# Patient Record
Sex: Female | Born: 1960
Health system: Southern US, Community
[De-identification: ages and names within clinical notes are randomized; demographics above are authoritative.]

## PROBLEM LIST (undated history)

## (undated) DIAGNOSIS — E785 Hyperlipidemia, unspecified: Secondary | ICD-10-CM

## (undated) DIAGNOSIS — G43909 Migraine, unspecified, not intractable, without status migrainosus: Secondary | ICD-10-CM

## (undated) DIAGNOSIS — T7840XA Allergy, unspecified, initial encounter: Secondary | ICD-10-CM

## (undated) DIAGNOSIS — M810 Age-related osteoporosis without current pathological fracture: Secondary | ICD-10-CM

## (undated) HISTORY — DX: Allergy, unspecified, initial encounter: T78.40XA

## (undated) HISTORY — PX: TUBAL LIGATION: SHX77

## (undated) HISTORY — DX: Migraine, unspecified, not intractable, without status migrainosus: G43.909

## (undated) HISTORY — DX: Hyperlipidemia, unspecified: E78.5

## (undated) HISTORY — DX: Age-related osteoporosis without current pathological fracture: M81.0

---

## 1999-12-19 HISTORY — PX: ABDOMINAL HYSTERECTOMY: SHX81

## 2000-05-04 ENCOUNTER — Other Ambulatory Visit: Admission: RE | Admit: 2000-05-04 | Discharge: 2000-05-04 | Payer: Self-pay | Admitting: Obstetrics and Gynecology

## 2000-10-11 ENCOUNTER — Encounter (INDEPENDENT_AMBULATORY_CARE_PROVIDER_SITE_OTHER): Payer: Self-pay | Admitting: Specialist

## 2000-10-11 ENCOUNTER — Inpatient Hospital Stay (HOSPITAL_COMMUNITY): Admission: RE | Admit: 2000-10-11 | Discharge: 2000-10-13 | Payer: Self-pay | Admitting: Obstetrics and Gynecology

## 2001-03-11 ENCOUNTER — Encounter: Payer: Self-pay | Admitting: Obstetrics and Gynecology

## 2001-03-11 ENCOUNTER — Encounter: Admission: RE | Admit: 2001-03-11 | Discharge: 2001-03-11 | Payer: Self-pay | Admitting: Obstetrics and Gynecology

## 2001-09-19 ENCOUNTER — Ambulatory Visit (HOSPITAL_COMMUNITY): Admission: RE | Admit: 2001-09-19 | Discharge: 2001-09-19 | Payer: Self-pay | Admitting: Family Medicine

## 2001-09-19 ENCOUNTER — Encounter: Payer: Self-pay | Admitting: Family Medicine

## 2002-01-06 ENCOUNTER — Other Ambulatory Visit: Admission: RE | Admit: 2002-01-06 | Discharge: 2002-01-06 | Payer: Self-pay | Admitting: Obstetrics and Gynecology

## 2002-10-28 ENCOUNTER — Encounter: Payer: Self-pay | Admitting: Obstetrics and Gynecology

## 2002-10-28 ENCOUNTER — Encounter: Admission: RE | Admit: 2002-10-28 | Discharge: 2002-10-28 | Payer: Self-pay | Admitting: Obstetrics and Gynecology

## 2004-01-15 ENCOUNTER — Ambulatory Visit (HOSPITAL_COMMUNITY): Admission: RE | Admit: 2004-01-15 | Discharge: 2004-01-15 | Payer: Self-pay | Admitting: Obstetrics and Gynecology

## 2005-04-07 ENCOUNTER — Ambulatory Visit (HOSPITAL_COMMUNITY): Admission: RE | Admit: 2005-04-07 | Discharge: 2005-04-07 | Payer: Self-pay | Admitting: Obstetrics and Gynecology

## 2006-04-12 ENCOUNTER — Ambulatory Visit (HOSPITAL_COMMUNITY): Admission: RE | Admit: 2006-04-12 | Discharge: 2006-04-12 | Payer: Self-pay | Admitting: Obstetrics and Gynecology

## 2007-05-27 ENCOUNTER — Encounter: Admission: RE | Admit: 2007-05-27 | Discharge: 2007-08-25 | Payer: Self-pay | Admitting: Sports Medicine

## 2008-01-10 ENCOUNTER — Ambulatory Visit (HOSPITAL_COMMUNITY): Admission: RE | Admit: 2008-01-10 | Discharge: 2008-01-10 | Payer: Self-pay | Admitting: Obstetrics and Gynecology

## 2009-11-03 ENCOUNTER — Ambulatory Visit (HOSPITAL_COMMUNITY): Admission: RE | Admit: 2009-11-03 | Discharge: 2009-11-03 | Payer: Self-pay | Admitting: Obstetrics and Gynecology

## 2011-02-01 ENCOUNTER — Other Ambulatory Visit (HOSPITAL_COMMUNITY): Payer: Self-pay | Admitting: Obstetrics and Gynecology

## 2011-02-01 DIAGNOSIS — Z1231 Encounter for screening mammogram for malignant neoplasm of breast: Secondary | ICD-10-CM

## 2011-02-17 ENCOUNTER — Ambulatory Visit (HOSPITAL_COMMUNITY)
Admission: RE | Admit: 2011-02-17 | Discharge: 2011-02-17 | Disposition: A | Discharge status: Home / Self Care | Payer: PRIVATE HEALTH INSURANCE | Source: Ambulatory Visit | Attending: Obstetrics and Gynecology | Admitting: Obstetrics and Gynecology

## 2011-02-17 DIAGNOSIS — Z1231 Encounter for screening mammogram for malignant neoplasm of breast: Secondary | ICD-10-CM | POA: Insufficient documentation

## 2011-05-05 NOTE — H&P (Signed)
[facility]  Patient:    Carla Moreno, Carla Moreno                        MRN: [id number] Adm. Date:  10/11/00 Attending:  Malachi Pro. Ambrose Mantle, M.D.                         History and Physical  PRESENT ILLNESS:  This is a 50 year old white married female, para 2-0-0-2, admitted to the hospital because of severe dysmenorrhea and menorrhagia, history of significant anemia on two occasions and intolerant of birth control pills, for abdominal hysterectomy.  Last menstrual period was September 25, 2000. Her periods occur every 24 to 30 days, the last seven days of regular flow and then she has seven days of a brown discharge.  She describes her periods as quite heavy, using a pad every hour, gushing when she stands up, using four pads at night to prevent accidents and stating she cannot function because of the bleeding.  The patient had been anemic from heavy bleeding at the time of a tubal ligation in 1997.  Hemoglobin was 9.6, hematocrit 29, indices consistent with iron deficiency.  She had been placed on iron and her hemoglobin was gradually built up to 12.7 on May 18, 1997.  Again in March of 2000, she was bothered by heavy bleeding, wearing a super-tampon and pad every hour and a half for three days; however, her hemoglobin at that time was still 11.6.  Because of her significant dysmenorrhea and menorrhagia and a history of pelvic endometriosis noted at laparoscopic tubal cauterization, she was placed on Demulen.  The Demulen almost immediately caused significant nausea and vomiting and headaches.  She was switched to Ortho Tri-Cyclen; again, she began having nausea.  She was advised to give it a while to see if it would significantly improve her dysmenorrhea and menorrhagia but she could not tolerate it.  In May of 2001, the patient was seen with a hemoglobin checked in our office at less than 10, but at a CBC at [employer], it was 10.4, hematocrit 31.6, platelet count  176,000, white count 6700, indices suggestive of iron deficiency.  She was advised to take iron and to consider surgery.  I advised surgery only if her problem was significant enough to be a problem for her.  She wanted to proceed with surgery.  She also requested that the surgery be of the type that I would be able to get the best look at her ovaries and because of that, the fact that she had had no vaginal deliveries or any labor, she has had two previous C-sections, she had poor uterine descent and was noted to have pelvic endometriosis at the time of the laparoscopic tubal, we will proceed with abdominal hysterectomy.  ALLERGIES:  No known allergies.  MEDICATIONS:  No medications at present except iron.  PAST MEDICAL HISTORY:  No significant illnesses.  No heart ailments.  OPERATIONS:  Patient had two C-sections.  She was never in labor.  Her first operation, a C-section, was done because of a breech presentation.  Her second operation for C-section was done because she was 41-1/2 weeks and had an estimated greater than 9-pound infant.  FAMILY HISTORY:  Mother died at 38 of infection or pneumonia.  Father 88, living and well.  No siblings.  REVIEW OF SYSTEMS:  Noncontributory except as in the present illness.  PHYSICAL EXAMINATION  GENERAL:  Well-developed,  well-nourished white female.  VITAL SIGNS:  Blood pressure 120/80, pulse 80, weight 139-3/4 pounds.  HEENT:  No cranial abnormalities.  Extraocular movements are intact.  Nose and pharynx are clear.  NECK:  Supple without thyromegaly.  HEART:  Normal size and sounds.  No murmurs.  LUNGS:  Clear to P&A.  BREASTS:  Soft, without masses.  ABDOMEN:  Soft and nontender.  There is a well-healed lower transverse scar. There are no masses palpable.  Liver, spleen and kidneys are not felt.  PELVIC:  Pap smear done on May 04, 2000 was within normal limits.  The vulva and vagina were clean.  Cervix clean.  Cervix descends  poorly.  The uterus is anterior, fairly small.  The adnexa are free of masses.  I do not feel any nodularity of the cul-de-sac.  ADMITTING IMPRESSION:  Severe dysmenorrhea and menorrhagia.  History of significant anemia on two occasions from menorrhagia.  Hemoccult pack has been done and is negative.  The patient is prepared for abdominal hysterectomy.  1. Menorrhagia. 2. Dysmenorrhea. 3. History of significant anemia, corrected on iron. 4. History of pelvic endometriosis.  PLAN:  The patient has been informed of the risks of surgery including, but not limited to, heart attack, stroke, pulmonary embolus, wound disruption, hemorrhage with need for re-operation and/or transfusion, nerve injury, fistula formation, intestinal obstruction.  She understands and agrees to proceed. DD:  10/10/00 TD:  10/11/00 Job: [id number]

## 2011-05-05 NOTE — Op Note (Signed)
[facility]  Patient:    Carla Moreno, Carla Moreno               MRN: [id number] Proc. Date: 10/11/00 Adm. Date:  [id number] Attending:  Malon Kindle                           Operative Report  PREOPERATIVE DIAGNOSES: 1. Abnormal uterine bleeding. 2. Severe dysmenorrhea. 3. Severe menorrhagia. 4. Pelvic endometriosis. 5. History of anemia.  POSTOPERATIVE DIAGNOSES: 1. Abnormal uterine bleeding. 2. Severe dysmenorrhea. 3. Severe menorrhagia. 4. Pelvic endometriosis. 5. History of anemia.  PROCEDURE:  Dilatation and curettage, abdominal hysterectomy.  SURGEON:  Malachi Pro. Ambrose Mantle, M.D.  ASSISTANT:  Alvino Chapel, M.D.  ANESTHESIA:  General anesthesia.  DESCRIPTION OF PROCEDURE:  Patient brought to the operating room and placed under satisfactory general anesthesia.  She was placed in lithotomy position. The vulva, vagina, urethra, and the perineum were prepped with Betadine solution.  The cervix was drawn into the operative field and sounded to a little over three inches anteriorly.  It was dilated so that I could do an endocervical and endometrial curettage.  I did both without problems, recovered some tissue, sent it to the lab for frozen section.  None of it looked suspicious.  I prepped the urethra, inserted a Foley catheter, placed the patient supine on the table, prepped the abdomen, and draped as a sterile field.  I protected her from Betadine with towels along her sides and under her perineum.  The old incision was utilized, and I carried it through the skin, skin and subcutaneous tissue, and fascia.  The rectus muscle was cut in the midline.  The peritoneum was opened vertically.  Exploration of the upper abdomen revealed the liver to be smooth.  The gallbladder was smooth and cystic.  Both kidneys felt normal.  Exploration of the pelvis revealed about a 1 cm area of endometriosis on the left uterosacral ligament.  The  cul-de-sac was otherwise normal.  The anterior cul-de-sac was free of adhesions from the two previous C-sections.  The uterus itself appeared to be upper limit of normal size and somewhat brown in hue.  The left tube and ovary appeared normal except for previous tubal ligation.  The right tube and ovary appeared normal except for previous tubal retraction.  Packs and retractors were used to prepare the operative field.  I identified both ureters.  The left ureter especially seemed to be riding real high on the broad ligament, so I took great care throughout the case to avoid this.  I placed clamps across the upper pedicles, divided both round ligaments with the Bovie, and sutured the round ligaments.  I divided the anterior peritoneum to put the bladder inferiorly.  I divided the utero-ovarian ligaments bilaterally and doubly suture ligated them.  Clamped, cut, and suture ligated the uterine vessels, the parametrial and paracervical tissues, and uterosacral ligaments, and held the uterosacral ligaments.  I then entered the right angle of the vagina, removed the uterus by transecting the upper vagina.  Vaginal angle sutures were placed, and then the central portion of the cuff was closed with interrupted figure-of-eight sutures of 0 Vicryl.  I tried to secure complete hemostasis in the pelvis, liberally irrigated to confirm hemostasis, confirmed that we were well away from both ureters, sutured the uterosacral ligaments together in the midline with two sutures of 0 Vicryl.  I tried to get the pelvis somewhat more support.  The patients uterus was quite mobile in spite of the fact that she had never had a labor or a vaginal delivery. Reperitonealization was done across the pelvis after we had filled the bladder with 200 cc of methylene blue-stained fluid and confirmed that we were well away from the bladder with any sutures.  After reperitonealization was done, the cuff appeared completely  dry.  Packs and retractors were removed.  The peritoneum was closed with a running suture of 0 Vicryl, the rectus muscle with interrupted 0 Vicryl, and the fascia with two running sutures of 0 Vicryl, subcutaneous tissue with running 3-0 Vicryl, and the skin was closed, initially attempted to close it with staples, although this did not work well because of the previous scarring, so I closed it with a running 3-0 Vicryl suture.  The patient seemed to tolerate the procedure well and returned to recovery in satisfactory condition.  Estimated blood loss was 200 cc. DD:  10/11/00 TD:  10/11/00 Job: [id number]

## 2011-05-05 NOTE — Discharge Summary (Signed)
[facility]  Patient:    Carla Moreno, Carla Moreno               MRN: [id number] Adm. Date:  [id number] Disc. Date: [id number] Attending:  Malon Kindle                           Discharge Summary  HISTORY OF PRESENT ILLNESS:  This is a 50 year old white female with menorrhagia, dysmenorrhea, history of anemia, and abnormal uterine bleeding admitted for [facility] and hysterectomy.  Abdominal hysterectomy was chosen as the preferred route.  Because the patient had never been in labor, she had had no vaginal deliveries, the cervix did not descend well.  She had a history of endometriosis at the uterosacral ligament at the time of laparoscopic tubal and the patient wanted me to get a real good look at her ovaries.  HOSPITAL COURSE:  The patient underwent D&C.  Frozen section revealed benign endometrium.  Abdominal hysterectomy by Dr. Ambrose Mantle with Dr. Senaida Ores assisting under general anesthesia with blood loss of about 200 cc.  The patient did well postoperatively.  She had a passage of flatus and had a bowel movement.  By the second postoperative day, her abdomen was soft and nontender.  Incision seemed to be healing well.  It did have some ecchymoses around it.  She was voiding well without difficulty and was ready for discharge.  Pathology report showed benign secretory endometrium, no hyperplasial malignancy, endocervical curettage with benign endocervical tissue, uterus with mild chronic cervicitis, no dysplasia, benign secretory endometrium, adenomyosis.  Hemoglobin on admission was 12.7, hematocrit 36.2, white count 8000, platelet count 207,000.  Follow-up hematocrits 29.9 and 29.2, normal differential.  Comprehensive metabolic profile was normal. Urinalysis was negative.  FINAL DIAGNOSIS:  Menorrhagia, dysmenorrhea, history of anemia, pelvic endometriosis, abnormal uterine bleeding.  OPERATION:  Dilation and curettage, abdominal  hysterectomy.  FINAL CONDITION:  Improved.  INSTRUCTIONS:  Include our regular discharge instructions, no vaginal entrance, no heavy lifting with strenuous activity.  Call with any unusual problems.  Take Mepergan Fortis 16 tablets one every 4 to 6 hours as needed for pain.  Return to the office in 10 to 14 days for follow-up examination. It should be noted that the patients left ureter was quite close to the infundibulopelvic ligament but it was protected at all times during the procedure. DD:  10/13/00 TD:  10/13/00 Job: [id number]

## 2011-09-04 ENCOUNTER — Ambulatory Visit (AMBULATORY_SURGERY_CENTER): Payer: PRIVATE HEALTH INSURANCE | Admitting: *Deleted

## 2011-09-04 VITALS — Ht 63.0 in | Wt 151.0 lb

## 2011-09-04 DIAGNOSIS — Z1211 Encounter for screening for malignant neoplasm of colon: Secondary | ICD-10-CM

## 2011-09-04 MED ORDER — PEG-KCL-NACL-NASULF-NA ASC-C 100 G PO SOLR: ORAL | Status: DC

## 2011-09-18 ENCOUNTER — Ambulatory Visit (AMBULATORY_SURGERY_CENTER): Payer: PRIVATE HEALTH INSURANCE | Admitting: Gastroenterology

## 2011-09-18 ENCOUNTER — Encounter: Payer: Self-pay | Admitting: Gastroenterology

## 2011-09-18 VITALS — BP 142/81 | HR 85 | Temp 98.3°F | Resp 15 | Ht 63.0 in | Wt 151.0 lb

## 2011-09-18 DIAGNOSIS — K6389 Other specified diseases of intestine: Secondary | ICD-10-CM

## 2011-09-18 DIAGNOSIS — Z1211 Encounter for screening for malignant neoplasm of colon: Secondary | ICD-10-CM

## 2011-09-18 DIAGNOSIS — K5730 Diverticulosis of large intestine without perforation or abscess without bleeding: Secondary | ICD-10-CM

## 2011-09-18 HISTORY — PX: COLONOSCOPY: SHX174

## 2011-09-18 MED ORDER — SODIUM CHLORIDE 0.9 % IV SOLN: 500.0000 mL | INTRAVENOUS | Status: DC

## 2011-09-18 NOTE — Progress Notes (Signed)
Pt had no complaints noted in the recovery room. maw

## 2011-09-18 NOTE — Patient Instructions (Signed)
Please refer to blue and green discharge instruction sheets. 

## 2011-09-19 ENCOUNTER — Telehealth: Payer: Self-pay | Admitting: *Deleted

## 2011-09-19 NOTE — Telephone Encounter (Signed)

## 2012-01-24 ENCOUNTER — Other Ambulatory Visit (HOSPITAL_COMMUNITY): Payer: Self-pay | Admitting: Obstetrics and Gynecology

## 2012-01-24 DIAGNOSIS — Z1231 Encounter for screening mammogram for malignant neoplasm of breast: Secondary | ICD-10-CM

## 2012-02-22 ENCOUNTER — Ambulatory Visit (HOSPITAL_COMMUNITY)
Admission: RE | Admit: 2012-02-22 | Discharge: 2012-02-22 | Disposition: A | Discharge status: Home / Self Care | Payer: PRIVATE HEALTH INSURANCE | Source: Ambulatory Visit | Attending: Obstetrics and Gynecology | Admitting: Obstetrics and Gynecology

## 2012-02-22 DIAGNOSIS — Z1231 Encounter for screening mammogram for malignant neoplasm of breast: Secondary | ICD-10-CM | POA: Insufficient documentation

## 2013-03-25 ENCOUNTER — Other Ambulatory Visit (HOSPITAL_COMMUNITY): Payer: Self-pay | Admitting: Obstetrics and Gynecology

## 2013-03-25 DIAGNOSIS — Z1231 Encounter for screening mammogram for malignant neoplasm of breast: Secondary | ICD-10-CM

## 2013-03-26 ENCOUNTER — Telehealth: Payer: Self-pay | Admitting: Family Medicine

## 2013-03-28 ENCOUNTER — Other Ambulatory Visit: Payer: PRIVATE HEALTH INSURANCE

## 2013-03-31 ENCOUNTER — Other Ambulatory Visit: Payer: Self-pay

## 2013-04-03 ENCOUNTER — Encounter: Payer: Self-pay | Admitting: Family Medicine

## 2013-04-03 ENCOUNTER — Ambulatory Visit (INDEPENDENT_AMBULATORY_CARE_PROVIDER_SITE_OTHER): Payer: PRIVATE HEALTH INSURANCE | Admitting: Family Medicine

## 2013-04-03 ENCOUNTER — Ambulatory Visit (HOSPITAL_COMMUNITY)
Admission: RE | Admit: 2013-04-03 | Discharge: 2013-04-03 | Disposition: A | Discharge status: Home / Self Care | Payer: PRIVATE HEALTH INSURANCE | Source: Ambulatory Visit | Attending: Obstetrics and Gynecology | Admitting: Obstetrics and Gynecology

## 2013-04-03 VITALS — BP 128/66 | HR 81 | Temp 97.7°F | Ht 63.0 in | Wt 148.4 lb

## 2013-04-03 DIAGNOSIS — Z8669 Personal history of other diseases of the nervous system and sense organs: Secondary | ICD-10-CM

## 2013-04-03 DIAGNOSIS — J310 Chronic rhinitis: Secondary | ICD-10-CM

## 2013-04-03 DIAGNOSIS — Z0000 Encounter for general adult medical examination without abnormal findings: Secondary | ICD-10-CM

## 2013-04-03 DIAGNOSIS — E7800 Pure hypercholesterolemia, unspecified: Secondary | ICD-10-CM | POA: Insufficient documentation

## 2013-04-03 DIAGNOSIS — E785 Hyperlipidemia, unspecified: Secondary | ICD-10-CM

## 2013-04-03 DIAGNOSIS — Z1231 Encounter for screening mammogram for malignant neoplasm of breast: Secondary | ICD-10-CM

## 2013-04-03 NOTE — Progress Notes (Signed)
  Subjective:    Patient ID: Carla Moreno, female    DOB: Jul 06, 1961, 52 y.o.   MRN: [id number]  HPI Patient brings in lab work from work. Her LDL continues to rise. Despite aggressive exercise and diet. No family history of cholesterol issues.   Review of Systems  Constitutional: Negative.   HENT: Negative.   Eyes: Negative.   Respiratory: Negative.   Cardiovascular: Negative.   Gastrointestinal: Positive for constipation (2-3 times week, taking MOM 4 nights per week).  Genitourinary: Negative.   Musculoskeletal: Negative.   Allergic/Immunologic: Negative.   Neurological: Negative.   Psychiatric/Behavioral: Negative for sleep disturbance. The patient is nervous/anxious (family stressors).        Objective:   Physical Exam BP 128/66  Pulse 81  Temp(Src) 97.7 F (36.5 C) (Oral)  Ht 5\' 3"  (1.6 m)  Wt 148 lb 6.4 oz (67.314 kg)  BMI 26.29 kg/m2  The patient appeared well nourished and normally developed, alert and oriented to time and place. Speech, behavior and judgement appear normal. Vital signs as documented.  Head exam is unremarkable. No scleral icterus or pallor noted. Nasal congestion bilaterally, throat was clear. Neck is without jugular venous distension, thyromegally, or carotid bruits. Carotid upstrokes are brisk bilaterally. No cervical adenopathy. Lungs are clear anteriorly and posteriorly to auscultation. Normal respiratory effort. Cardiac exam reveals regular rate and rhythm@ 72/min. First and second heart sounds normal. No murmurs, rubs or gallops.  Abdominal exam reveals normal bowl sounds, no masses, no organomegaly and no aortic enlargement. No inguinal adenopathy. Extremities are nonedematous and both femoral and pedal pulses are normal. Skin without pallor or jaundice.  Warm and dry, without rash. Neurologic exam reveals normal deep tendon reflexes and normal sensation.          Assessment & Plan:  1. Hx of migraines  sees Dr. Anne Hahn for this  yearly  2. Hyperlipidemia Try Crestor 5 mg one half on Monday Wednesday and Friday  3. Rhinitis  4. Routine general medical examination at a health care facility   Patient Instructions  Continue therapeutic lifestyle changes which includes diet and exercise Try Crestor samples 5 mg just a half a one on Monday Wednesday and Friday, samples of Crestor given If any aches and pains discontinue medicine  do a traditional lipid liver panel in 3 months

## 2013-04-03 NOTE — Patient Instructions (Addendum)
Continue therapeutic lifestyle changes which includes diet and exercise Try Crestor samples 5 mg just a half a one on Monday Wednesday and Friday, samples of Crestor given If any aches and pains discontinue medicine  do a traditional lipid liver panel in 3 months

## 2013-04-07 ENCOUNTER — Encounter: Payer: Self-pay | Admitting: Family Medicine

## 2013-04-23 NOTE — Telephone Encounter (Signed)
Please advise 

## 2013-04-23 NOTE — Telephone Encounter (Signed)
Was this done?

## 2013-06-05 ENCOUNTER — Other Ambulatory Visit: Payer: Self-pay

## 2013-06-05 MED ORDER — NORTRIPTYLINE HCL 25 MG PO CAPS: 25.0000 mg | ORAL_CAPSULE | Freq: Every day | ORAL | Status: DC

## 2013-12-09 ENCOUNTER — Other Ambulatory Visit: Payer: Self-pay

## 2013-12-09 MED ORDER — NORTRIPTYLINE HCL 25 MG PO CAPS: 25.0000 mg | ORAL_CAPSULE | Freq: Every day | ORAL | Status: DC

## 2014-01-05 ENCOUNTER — Other Ambulatory Visit: Payer: Self-pay

## 2014-01-05 MED ORDER — SUMATRIPTAN SUCCINATE 100 MG PO TABS: 100.0000 mg | ORAL_TABLET | ORAL | Status: DC | PRN

## 2014-01-15 ENCOUNTER — Encounter: Payer: Self-pay | Admitting: Neurology

## 2014-01-15 ENCOUNTER — Encounter (INDEPENDENT_AMBULATORY_CARE_PROVIDER_SITE_OTHER): Payer: Self-pay

## 2014-01-15 ENCOUNTER — Ambulatory Visit (INDEPENDENT_AMBULATORY_CARE_PROVIDER_SITE_OTHER): Payer: 59 | Admitting: Neurology

## 2014-01-15 VITALS — BP 134/66 | HR 82 | Wt 147.0 lb

## 2014-01-15 DIAGNOSIS — Z8669 Personal history of other diseases of the nervous system and sense organs: Secondary | ICD-10-CM

## 2014-01-15 MED ORDER — SUMATRIPTAN SUCCINATE 100 MG PO TABS: 100.0000 mg | ORAL_TABLET | Freq: Two times a day (BID) | ORAL | Status: DC | PRN

## 2014-01-15 MED ORDER — NORTRIPTYLINE HCL 10 MG PO CAPS: 10.0000 mg | ORAL_CAPSULE | Freq: Every day | ORAL | Status: DC

## 2014-01-15 NOTE — Patient Instructions (Signed)
Migraine Headache A migraine headache is an intense, throbbing pain on one or both sides of your head. A migraine can last for 30 minutes to several hours. CAUSES  The exact cause of a migraine headache is not always known. However, a migraine may be caused when nerves in the brain become irritated and release chemicals that cause inflammation. This causes pain. Certain things may also trigger migraines, such as:  Alcohol.  Smoking.  Stress.  Menstruation.  Aged cheeses.  Foods or drinks that contain nitrates, glutamate, aspartame, or tyramine.  Lack of sleep.  Chocolate.  Caffeine.  Hunger.  Physical exertion.  Fatigue.  Medicines used to treat chest pain (nitroglycerine), birth control pills, estrogen, and some blood pressure medicines. SIGNS AND SYMPTOMS  Pain on one or both sides of your head.  Pulsating or throbbing pain.  Severe pain that prevents daily activities.  Pain that is aggravated by any physical activity.  Nausea, vomiting, or both.  Dizziness.  Pain with exposure to bright lights, loud noises, or activity.  General sensitivity to bright lights, loud noises, or smells. Before you get a migraine, you may get warning signs that a migraine is coming (aura). An aura may include:  Seeing flashing lights.  Seeing bright spots, halos, or zig-zag lines.  Having tunnel vision or blurred vision.  Having feelings of numbness or tingling.  Having trouble talking.  Having muscle weakness. DIAGNOSIS  A migraine headache is often diagnosed based on:  Symptoms.  Physical exam.  A CT scan or MRI of your head. These imaging tests cannot diagnose migraines, but they can help rule out other causes of headaches. TREATMENT Medicines may be given for pain and nausea. Medicines can also be given to help prevent recurrent migraines.  HOME CARE INSTRUCTIONS  Only take over-the-counter or prescription medicines for pain or discomfort as directed by your  health care provider. The use of long-term narcotics is not recommended.  Lie down in a dark, quiet room when you have a migraine.  Keep a journal to find out what may trigger your migraine headaches. For example, write down:  What you eat and drink.  How much sleep you get.  Any change to your diet or medicines.  Limit alcohol consumption.  Quit smoking if you smoke.  Get 7 9 hours of sleep, or as recommended by your health care provider.  Limit stress.  Keep lights dim if bright lights bother you and make your migraines worse. SEEK IMMEDIATE MEDICAL CARE IF:   Your migraine becomes severe.  You have a fever.  You have a stiff neck.  You have vision loss.  You have muscular weakness or loss of muscle control.  You start losing your balance or have trouble walking.  You feel faint or pass out.  You have severe symptoms that are different from your first symptoms. MAKE SURE YOU:   Understand these instructions.  Will watch your condition.  Will get help right away if you are not doing well or get worse. Document Released: 12/04/2005 Document Revised: 09/24/2013 Document Reviewed: 08/11/2013 [employer] Patient Information 2014 [employer].  

## 2014-01-15 NOTE — Progress Notes (Signed)
    Reason for visit: Migraine headache  Carla Moreno is an 53 y.o. female  History of present illness:  Carla Moreno is a 54 year old right-handed white female with a history of migraine headaches. The patient has done very well over the last year or year and a half. The patient averages less than one headache a month. The patient is on nortriptyline at 25 mg at night, and she will take half of the Imitrex tablet, 100 mg, if needed for the headache. The patient is on estradiol for medical symptoms, and she believes that this may have helped her headaches as well. The patient indicates that weather changes may bring on the headache. The patient has found that she is now able to drink red wine. The patient returns to this office for an evaluation.  Past Medical History  Diagnosis Date  . Migraines     Past Surgical History  Procedure Laterality Date  . Abdominal hysterectomy  2001  . Cesarean section      2 occasions    Family History  Problem Relation Age of Onset  . Pneumonia Mother   . Migraines Neg Hx     Social history:  reports that she quit smoking about 27 years ago. Her smoking use included Cigarettes. She started smoking about 37 years ago. She smoked 0.50 packs per day. She has never used smokeless tobacco. She reports that she drinks about 1.2 ounces of alcohol per week. She reports that she does not use illicit drugs.   No Known Allergies  Medications:  Current Outpatient Prescriptions on File Prior to Visit  Medication Sig Dispense Refill  . Cholecalciferol (VITAMIN D PO) Take 2,000 Units by mouth daily.        No current facility-administered medications on file prior to visit.    ROS:  Out of a complete 14 system review of symptoms, the patient complains only of the following symptoms, and all other reviewed systems are negative.  Migraine headache Restless legs  Blood pressure 134/66, pulse 82, weight 147 lb (66.679 kg).  Physical  Exam  General: The patient is alert and cooperative at the time of the examination.  Skin: No significant peripheral edema is noted.   Neurologic Exam  Mental status: The patient is oriented x 3.  Cranial nerves: Facial symmetry is present. Speech is normal, no aphasia or dysarthria is noted. Extraocular movements are full. Visual fields are full.  Motor: The patient has good strength in all 4 extremities.  Sensory examination: Soft touch sensation on the face, arms, and legs is symmetric.  Coordination: The patient has good finger-nose-finger and heel-to-shin bilaterally.  Gait and station: The patient has a normal gait. Tandem gait is normal. Romberg is negative. No drift is seen.  Reflexes: Deep tendon reflexes are symmetric.   Assessment/Plan:  1. Migraine headache  The patient is doing well with the headaches at this point. The patient will taper down on the nortriptyline going to 10 mg at night for one month, and then stop the medication. If the headaches return, she will go back on the medication. The patient will take Imitrex if needed. The patient will followup in one year.  Jill Alexanders MD 01/15/2014 7:24 PM  [facility] [address] [place], [place] [address]  Phone [phone] Fax [phone]

## 2014-04-13 ENCOUNTER — Other Ambulatory Visit (HOSPITAL_COMMUNITY): Payer: Self-pay | Admitting: Obstetrics and Gynecology

## 2014-04-13 DIAGNOSIS — Z1231 Encounter for screening mammogram for malignant neoplasm of breast: Secondary | ICD-10-CM

## 2014-04-21 ENCOUNTER — Ambulatory Visit (HOSPITAL_COMMUNITY)
Admission: RE | Admit: 2014-04-21 | Discharge: 2014-04-21 | Disposition: A | Discharge status: Home / Self Care | Payer: 59 | Source: Ambulatory Visit | Attending: Obstetrics and Gynecology | Admitting: Obstetrics and Gynecology

## 2014-04-21 DIAGNOSIS — Z1231 Encounter for screening mammogram for malignant neoplasm of breast: Secondary | ICD-10-CM | POA: Insufficient documentation

## 2014-04-29 ENCOUNTER — Ambulatory Visit (INDEPENDENT_AMBULATORY_CARE_PROVIDER_SITE_OTHER): Payer: 59

## 2014-04-29 ENCOUNTER — Encounter: Payer: Self-pay | Admitting: Family Medicine

## 2014-04-29 ENCOUNTER — Other Ambulatory Visit: Payer: Self-pay | Admitting: Family Medicine

## 2014-04-29 ENCOUNTER — Ambulatory Visit (INDEPENDENT_AMBULATORY_CARE_PROVIDER_SITE_OTHER): Payer: 59 | Admitting: Family Medicine

## 2014-04-29 VITALS — BP 129/77 | HR 67 | Temp 97.7°F | Ht 63.0 in | Wt 141.0 lb

## 2014-04-29 DIAGNOSIS — M79672 Pain in left foot: Secondary | ICD-10-CM

## 2014-04-29 DIAGNOSIS — Z23 Encounter for immunization: Secondary | ICD-10-CM

## 2014-04-29 DIAGNOSIS — G589 Mononeuropathy, unspecified: Secondary | ICD-10-CM

## 2014-04-29 DIAGNOSIS — M79604 Pain in right leg: Secondary | ICD-10-CM

## 2014-04-29 DIAGNOSIS — E559 Vitamin D deficiency, unspecified: Secondary | ICD-10-CM

## 2014-04-29 DIAGNOSIS — M25551 Pain in right hip: Secondary | ICD-10-CM

## 2014-04-29 DIAGNOSIS — G629 Polyneuropathy, unspecified: Secondary | ICD-10-CM

## 2014-04-29 DIAGNOSIS — M79609 Pain in unspecified limb: Secondary | ICD-10-CM

## 2014-04-29 DIAGNOSIS — Z0000 Encounter for general adult medical examination without abnormal findings: Secondary | ICD-10-CM

## 2014-04-29 DIAGNOSIS — Z13820 Encounter for screening for osteoporosis: Secondary | ICD-10-CM

## 2014-04-29 DIAGNOSIS — E785 Hyperlipidemia, unspecified: Secondary | ICD-10-CM

## 2014-04-29 DIAGNOSIS — R52 Pain, unspecified: Secondary | ICD-10-CM

## 2014-04-29 DIAGNOSIS — M25559 Pain in unspecified hip: Secondary | ICD-10-CM

## 2014-04-29 DIAGNOSIS — Z79899 Other long term (current) drug therapy: Secondary | ICD-10-CM | POA: Insufficient documentation

## 2014-04-29 DIAGNOSIS — Z79818 Long term (current) use of other agents affecting estrogen receptors and estrogen levels: Secondary | ICD-10-CM | POA: Insufficient documentation

## 2014-04-29 DIAGNOSIS — M79605 Pain in left leg: Secondary | ICD-10-CM

## 2014-04-29 NOTE — Progress Notes (Signed)
Subjective:    Patient ID: Carla Moreno, female    DOB: [birth year], 53 y.o.   MRN: [id number]  HPI Patient is here today for annual wellness exam and follow up of chronic medical problems. The patient does complain of bilateral leg pain every night. She also complains of left heel pain for a couple of weeks. She is up-to-date on her colonoscopies. She will need to have a Prevnar vaccine and a single shot. Because of her history of hyperlipidemia it will probably be wise also to schedule her for a stress test.         Patient Active Problem List   Diagnosis Date Noted  . Hx of migraines 04/03/2013  . Hyperlipidemia 04/03/2013  . Special screening for malignant neoplasms, colon 09/18/2011  . Diverticulosis of colon (without mention of hemorrhage) 09/18/2011  . Melanosis coli 09/18/2011   Outpatient Encounter Prescriptions as of 04/29/2014  Medication Sig  . b complex vitamins capsule Take 1 capsule by mouth daily.  . Cholecalciferol (VITAMIN D PO) Take 2,000 Units by mouth daily.   Marland Kitchen estradiol (ESTRACE) 0.5 MG tablet Take 0.25 mg by mouth daily.   . Flaxseed, Linseed, (FLAXSEED OIL) 1200 MG CAPS Take 1,200 mg by mouth daily.  . SUMAtriptan (IMITREX) 100 MG tablet Take 1 tablet (100 mg total) by mouth 2 (two) times daily as needed for migraine. Do not exceed 2 tabs per 24 hours  . RESTASIS 0.05 % ophthalmic emulsion Place 0.05 drops into both eyes as needed.  . [DISCONTINUED] nortriptyline (PAMELOR) 10 MG capsule Take 1 capsule (10 mg total) by mouth at bedtime.    Review of Systems  Constitutional: Negative.   HENT: Negative.   Eyes: Negative.   Respiratory: Negative.   Cardiovascular: Negative.   Gastrointestinal: Negative.   Endocrine: Negative.   Genitourinary: Negative.   Musculoskeletal: Positive for arthralgias (bilateral leg pain every night- "feels like they are on fire").       Left HEEL pain - last 2 weeks  Skin: Negative.   Allergic/Immunologic: Negative.    Neurological: Negative.   Hematological: Negative.   Psychiatric/Behavioral: Negative.        Objective:   Physical Exam  Nursing note and vitals reviewed. Constitutional: She is oriented to person, place, and time. She appears well-developed and well-nourished. No distress.  [place]aza positive and cooperative  HENT:  Head: Normocephalic and atraumatic.  Right Ear: External ear normal.  Left Ear: External ear normal.  Nose: Nose normal.  Mouth/Throat: Oropharynx is clear and moist.  Minimal nasal congestion without symptom  Eyes: Conjunctivae and EOM are normal. Pupils are equal, round, and reactive to light. Right eye exhibits no discharge. Left eye exhibits no discharge. No scleral icterus.  Neck: Normal range of motion. Neck supple. No thyromegaly present.  No axillary adenopathy  Cardiovascular: Normal rate, regular rhythm, normal heart sounds and intact distal pulses.  Exam reveals no gallop and no friction rub.   No murmur heard. At 72 per minute  Pulmonary/Chest: Effort normal and breath sounds normal. No respiratory distress. She has no wheezes. She has no rales. She exhibits no tenderness.  Abdominal: Soft. Bowel sounds are normal. She exhibits no mass. There is no tenderness. There is no rebound and no guarding.  No inguinal adenopathy with good inguinal pulses  Musculoskeletal: Normal range of motion. She exhibits no edema and no tenderness.  There was no discomfort with leg raising or lowering with or without resistance. There was no discomfort with hip  abduction or abduction. There was tenderness to palpation over the posterior right hip.  Lymphadenopathy:    She has no cervical adenopathy.  Neurological: She is alert and oriented to person, place, and time. She has normal reflexes. No cranial nerve deficit. She exhibits normal muscle tone. Coordination normal.  Skin: Skin is warm and dry. No rash noted.  Psychiatric: She has a normal mood and affect. Her behavior is  normal. Judgment and thought content normal.   BP 129/77  Pulse 67  Temp(Src) 97.7 F (36.5 C) (Oral)  Ht 5' 3" (1.6 m)  Wt 141 lb (63.957 kg)  BMI 24.98 kg/m2  [facility] reading (PRIMARY) by  Dr. Moore-chest x-ray, LS-spine, left heel , right hip      Left heel spur. No active disease in the chest, right hip minimal degenerative change LS-spine was not done before the patient left the office, but she will be called back in to get an LS spine.  EKG: Within normal limit                                    Assessment & Plan:  1. Hyperlipidemia - BMP8+EGFR - Hepatic function panel - NMR, lipoprofile - EKG 12-Lead  2. Vitamin D deficiency - Vit D  25 hydroxy (rtn osteoporosis monitoring)  3. Annual physical exam - DG Bone Density; Future - DG Chest 2 View; Future - DG Foot Complete Left; Future - BMP8+EGFR - Hepatic function panel - NMR, lipoprofile - Thyroid Panel With TSH - Vit D  25 hydroxy (rtn osteoporosis monitoring) - EKG 12-Lead - Anemia Profile B  4. Pain of left heel - DG Foot Complete Left; Future  5. Screening for osteoporosis - DG Bone Density; Future  6. Bilateral leg pain - Anemia Profile B  7. Right hip pain -X-ray right hip  8. Neuropathy  Patient Instructions  Continue current medications. Continue good therapeutic lifestyle changes which include good diet and exercise. Fall precautions discussed with patient. If an FOBT was given today- please return it to our front desk. If you are over 50 years old - you may need Prevnar 13 or the adult Pneumonia vaccine.  Drink plenty of water Be sure and walk and exercise during the day  We will call you the results of the lab work once those results are available We will call you with the results of the x-rays once those results are available Please check with your insurance regarding the Prevnar vaccine and the shingles shot.   Don W. Moore MD    

## 2014-04-29 NOTE — Patient Instructions (Addendum)
Continue current medications. Continue good therapeutic lifestyle changes which include good diet and exercise. Fall precautions discussed with patient. If an FOBT was given today- please return it to our front desk. If you are over 53 years old - you may need Prevnar 17 or the adult Pneumonia vaccine.  Drink plenty of water Be sure and walk and exercise during the day  We will call you the results of the lab work once those results are available We will call you with the results of the x-rays once those results are available Please check with your insurance regarding the Prevnar vaccine and the shingles shot.

## 2014-04-30 LAB — THYROID PANEL WITH TSH
Free Thyroxine Index: 2 (ref 1.2–4.9)
T3 UPTAKE RATIO: 26 % (ref 24–39)
T4, Total: 7.8 ug/dL (ref 4.5–12.0)
TSH: 1.66 u[IU]/mL (ref 0.450–4.500)

## 2014-04-30 LAB — BMP8+EGFR
BUN / CREAT RATIO: 17 (ref 9–23)
BUN: 11 mg/dL (ref 6–24)
CO2: 24 mmol/L (ref 18–29)
Calcium: 9.7 mg/dL (ref 8.7–10.2)
Chloride: 98 mmol/L (ref 97–108)
Creatinine, Ser: 0.66 mg/dL (ref 0.57–1.00)
GFR calc non Af Amer: 101 mL/min/{1.73_m2} (ref >59)
GFR, EST AFRICAN AMERICAN: 117 mL/min/{1.73_m2} (ref >59)
Glucose: 79 mg/dL (ref 65–99)
POTASSIUM: 4.8 mmol/L (ref 3.5–5.2)
SODIUM: 142 mmol/L (ref 134–144)

## 2014-04-30 LAB — HEPATIC FUNCTION PANEL
ALK PHOS: 57 IU/L (ref 39–117)
ALT: 18 IU/L (ref 0–32)
AST: 22 IU/L (ref 0–40)
Albumin: 4.8 g/dL (ref 3.5–5.5)
BILIRUBIN DIRECT: 0.14 mg/dL (ref 0.00–0.40)
Total Bilirubin: 0.8 mg/dL (ref 0.0–1.2)
Total Protein: 7.4 g/dL (ref 6.0–8.5)

## 2014-04-30 LAB — ANEMIA PROFILE B
BASOS ABS: 0 10*3/uL (ref 0.0–0.2)
Basos: 1 %
EOS ABS: 0.3 10*3/uL (ref 0.0–0.4)
Eos: 4 %
FERRITIN: 70 ng/mL (ref 15–150)
Folate: >19.9 ng/mL (ref >3.0)
HCT: 37.9 % (ref 34.0–46.6)
Hemoglobin: 12.9 g/dL (ref 11.1–15.9)
IMMATURE GRANS (ABS): 0 10*3/uL (ref 0.0–0.1)
IMMATURE GRANULOCYTES: 0 %
Iron Saturation: 26 % (ref 15–55)
Iron: 89 ug/dL (ref 35–155)
Lymphocytes Absolute: 2.2 10*3/uL (ref 0.7–3.1)
Lymphs: 35 %
MCH: 32.1 pg (ref 26.6–33.0)
MCHC: 34 g/dL (ref 31.5–35.7)
MCV: 94 fL (ref 79–97)
MONOS ABS: 0.4 10*3/uL (ref 0.1–0.9)
Monocytes: 7 %
NEUTROS PCT: 53 %
Neutrophils Absolute: 3.4 10*3/uL (ref 1.4–7.0)
PLATELETS: 219 10*3/uL (ref 150–379)
RBC: 4.02 x10E6/uL (ref 3.77–5.28)
RDW: 13.4 % (ref 12.3–15.4)
Retic Ct Pct: 0.9 % (ref 0.6–2.6)
TIBC: 341 ug/dL (ref 250–450)
UIBC: 252 ug/dL (ref 150–375)
VITAMIN B 12: 606 pg/mL (ref 211–946)
WBC: 6.3 10*3/uL (ref 3.4–10.8)

## 2014-04-30 LAB — NMR, LIPOPROFILE
Cholesterol: 222 mg/dL — ABNORMAL HIGH (ref 100–199)
HDL Cholesterol by NMR: 52 mg/dL (ref >39)
HDL Particle Number: 37 umol/L (ref >=30.5)
LDL PARTICLE NUMBER: 1696 nmol/L — AB (ref <1000)
LDL SIZE: 21.3 nm (ref >20.5)
LDLC SERPL CALC-MCNC: 149 mg/dL — ABNORMAL HIGH (ref 0–99)
LP-IR SCORE: 43 (ref <=45)
Small LDL Particle Number: 690 nmol/L — ABNORMAL HIGH (ref <=527)
Triglycerides by NMR: 103 mg/dL (ref 0–149)

## 2014-04-30 LAB — VITAMIN D 25 HYDROXY (VIT D DEFICIENCY, FRACTURES): Vit D, 25-Hydroxy: 34.9 ng/mL (ref 30.0–100.0)

## 2014-07-15 ENCOUNTER — Other Ambulatory Visit: Payer: 59

## 2014-07-15 ENCOUNTER — Ambulatory Visit: Payer: 59

## 2014-07-16 ENCOUNTER — Telehealth: Payer: Self-pay | Admitting: *Deleted

## 2014-07-16 NOTE — Telephone Encounter (Signed)
Called patient to r/s appointment time on 01/15/14, patient did not answer left message to return the call to r/s appointment with NP MM. CM last seen patient in 2013, appointments should now be made with NP MM.

## 2014-07-27 ENCOUNTER — Encounter: Payer: Self-pay | Admitting: Nurse Practitioner

## 2014-10-02 ENCOUNTER — Other Ambulatory Visit: Payer: Self-pay

## 2014-10-07 ENCOUNTER — Ambulatory Visit: Payer: 59

## 2014-10-07 ENCOUNTER — Encounter: Payer: Self-pay | Admitting: Pharmacist

## 2014-10-07 ENCOUNTER — Ambulatory Visit (INDEPENDENT_AMBULATORY_CARE_PROVIDER_SITE_OTHER): Payer: 59 | Admitting: Pharmacist

## 2014-10-07 VITALS — BP 112/76 | HR 70 | Ht 63.0 in | Wt 142.0 lb

## 2014-10-07 DIAGNOSIS — E7800 Pure hypercholesterolemia, unspecified: Secondary | ICD-10-CM

## 2014-10-07 NOTE — Progress Notes (Signed)
Lipid Clinic Consultation  Chief Complaint:   Chief Complaint  Patient presents with  . Hyperlipidemia     HPI:  Patient is here today to discussed last lipid panel.  She expressed desire to learn about foods that affect lipids and if she needs to take cholesterol medications.   She has no family history of CAD.  Paternal GM had hyperlipidemia which was controlled with diet over 20 years ago. Patient follows a fairly healthy diet - cans her own vegetables, eats venison, fish, tries to limit processed foods Exercises very regularly - 4 to 5 days per week - Zumba, running or exercise classes     Component Value Date/Time   CHOL 222 04/30/2014   LDL  149 04/30/2014   LDL-P 1696 04/30/2014   HDL 52 04/30/2014   TRIG 103 04/30/2014    CHD/CHF Risk Equivalents:  none AHA ASCVD 10 year risk = 1.5% (low risk of ASCVD) NCEP Risk Factors Present:  none Primary Problem(s):  LDL or LDL-P elevated   Secondary cause of hyperlipidemia present:  Estrogen use  Filed Vitals:   10/07/14 1543  BP: 112/76  Pulse: 70   Filed Weights   10/07/14 1543  Weight: 142 lb (64.411 kg)   Body mass index is 25.16 kg/(m^2). General Appearance:  alert, oriented, no acute distress and well nourished Mood/Affect:  normal    Assessment: Elevated LDL particle number but low ASCVD risk - statin therapy not recommended at this time  Plan: Discussed Meditere[place] Diet in depth.   Increase non-starchy vegetables - carrots, green bean, squash, zucchini, tomatoes, onions, peppers, spinach and other green leafy vegetables, cabbage, lettuce, cucumbers, asparagus, okra (not fried), eggplant limit sugar and processed foods (cakes, cookies, ice cream, crackers and chips) Increase fresh fruit - especially berries limit red meat to no more than 1-2 times per week (serving size about the size of your palm) Choose whole grains / lean proteins - whole wheat bread, quinoa, whole grain rice (1/2 cup), fish, chicken,  [place] Monounsaturated fats - avoid saturated and trans fats  Continue current exercise Recheck lipids January 2016 - has appt with Dr Laurance Flatten  Cherre Robins, PharmD, CPP

## 2015-01-15 ENCOUNTER — Ambulatory Visit: Payer: 59 | Admitting: Nurse Practitioner

## 2015-02-03 ENCOUNTER — Telehealth: Payer: Self-pay

## 2015-02-03 NOTE — Telephone Encounter (Signed)
Called patient  And left message call the office and reschedule her apt. With Carolyn 03-18-15 at 3:00. CM/Willis. Please put patient on the schedule when she calls back. Thanks Hinton Dyer.

## 2015-03-18 ENCOUNTER — Ambulatory Visit: Payer: 59 | Admitting: Nurse Practitioner

## 2015-03-19 ENCOUNTER — Ambulatory Visit (INDEPENDENT_AMBULATORY_CARE_PROVIDER_SITE_OTHER): Payer: 59 | Admitting: Nurse Practitioner

## 2015-03-19 ENCOUNTER — Encounter: Payer: Self-pay | Admitting: Nurse Practitioner

## 2015-03-19 VITALS — BP 118/68 | HR 68 | Ht 63.0 in | Wt 140.8 lb

## 2015-03-19 DIAGNOSIS — G43909 Migraine, unspecified, not intractable, without status migrainosus: Secondary | ICD-10-CM | POA: Diagnosis not present

## 2015-03-19 MED ORDER — SUMATRIPTAN SUCCINATE 100 MG PO TABS: 100.0000 mg | ORAL_TABLET | Freq: Two times a day (BID) | ORAL | Status: DC | PRN

## 2015-03-19 NOTE — Progress Notes (Signed)
[facility]  PATIENT: Carla Moreno DOB: 06/10/1961   REASON FOR VISIT: Follow-up for migraines HISTORY FROM: Patient    HISTORY OF PRESENT ILLNESS:Carla Moreno is a 54 year old right-handed white female with a history of migraine headaches. She was last seen by Dr. Jannifer Franklin 01/15/2014. At that time she decided to taper and discontinue nortriptyline which she did without problems. She is having about 1 migraine per month relieved with Imitrex acutely The patient is on estradiol for medical symptoms, and she believes that this may have helped her headaches as well. The patient indicates that weather changes may bring on the headache. Carla Moreno is aware of migraine triggers and tries to avoid those. The patient returns to this office for an evaluation.   REVIEW OF SYSTEMS: Full 14 system review of systems performed and notable only for those listed, all others are neg:  Constitutional: neg  Cardiovascular: neg Ear/Nose/Throat: neg  Skin: neg Eyes: neg Respiratory: neg Gastroitestinal: neg  Hematology/Lymphatic: neg  Endocrine: neg Musculoskeletal: Aching muscles Allergy/Immunology: neg Neurological: neg Psychiatric: neg Sleep : neg   ALLERGIES: No Known Allergies  HOME MEDICATIONS: Outpatient Prescriptions Prior to Visit  Medication Sig Dispense Refill  . b complex vitamins capsule Take 1 capsule by mouth daily.    . Cholecalciferol (VITAMIN D PO) Take 2,000 Units by mouth daily.     Marland Kitchen estradiol (ESTRACE) 0.5 MG tablet Take 0.25 mg by mouth daily.     . Flaxseed, Linseed, (FLAXSEED OIL) 1200 MG CAPS Take 1,200 mg by mouth daily.    . RESTASIS 0.05 % ophthalmic emulsion Place 0.05 drops into both eyes as needed.    . SUMAtriptan (IMITREX) 100 MG tablet Take 1 tablet (100 mg total) by mouth 2 (two) times daily as needed for migraine. Do not exceed 2 tabs per 24 hours 9 tablet 5   No facility-administered medications prior to visit.    PAST  MEDICAL HISTORY: Past Medical History  Diagnosis Date  . Migraines   . Hyperlipidemia     PAST SURGICAL HISTORY: Past Surgical History  Procedure Laterality Date  . Abdominal hysterectomy  2001  . Cesarean section      2 occasions    FAMILY HISTORY: Family History  Problem Relation Age of Onset  . Pneumonia Mother   . Migraines Neg Hx   . Hyperlipidemia Paternal Grandmother     diet controlled    SOCIAL HISTORY: History   Social History  . Marital Status: Married    Spouse Name: N/A  . Number of Children: N/A  . Years of Education: N/A   Occupational History  . Not on file.   Social History Main Topics  . Smoking status: Former Smoker -- 0.50 packs/day    Types: Cigarettes    Start date: 12/18/1976    Quit date: 12/18/1986  . Smokeless tobacco: Never Used  . Alcohol Use: 1.2 oz/week    2 Glasses of wine per week  . Drug Use: No  . Sexual Activity: Not on file   Other Topics Concern  . Not on file   Social History Narrative     PHYSICAL EXAM  Filed Vitals:   03/19/15 1049  BP: 118/68  Pulse: 68  Height: 5\' 3"  (1.6 m)  Weight: 140 lb 12.8 oz (63.866 kg)   Body mass index is 24.95 kg/(m^2).  Generalized: Well developed, in no acute distress  Neurological examination   Mentation: Alert oriented to time, place, history taking. Attention span and concentration  appropriate. Recent and remote memory intact.  Follows all commands speech and language fluent.   Cranial nerve II-XII: Pupils were equal round reactive to light extraocular movements were full, visual field were full on confrontational test. Facial sensation and strength were normal. hearing was intact to finger rubbing bilaterally. Uvula tongue midline. head turning and shoulder shrug were normal and symmetric.Tongue protrusion into cheek strength was normal. Motor: normal bulk and tone, full strength in the BUE, BLE, fine finger movements normal, no pronator drift. No focal  weakness Coordination: finger-nose-finger, heel-to-shin bilaterally, no dysmetria Reflexes: Brachioradialis 2/2, biceps 2/2, triceps 2/2, patellar 2/2, Achilles 2/2, plantar responses were flexor bilaterally. Gait and Station: Rising up from seated position without assistance, normal stance,  moderate stride, good arm swing, smooth turning, able to perform tiptoe, and heel walking without difficulty. Tandem gait is steady  DIAGNOSTIC DATA (LABS, IMAGING, TESTING)   ASSESSMENT AND PLAN  54 y.o. year old female  has a past medical history of Migraines here to follow-up. She did well with tapering of her nortriptyline. She remains on Imitrex acutely  Continue Imitrex acutely for migraine refill for one year Call for increase in headaches Follow-up yearly and when necessary Dennie Bible, [facility], [facility], [facility] [address] [place], [place] [address] [phone]

## 2015-03-19 NOTE — Progress Notes (Signed)
I have read the note, and I agree with the clinical assessment and plan.  Yarely Bebee KEITH   

## 2015-03-19 NOTE — Patient Instructions (Signed)
Continue Imitrex acutely for migraine refill for one year Call for increase in headaches Follow-up yearly and when necessary

## 2015-08-12 ENCOUNTER — Other Ambulatory Visit (HOSPITAL_COMMUNITY): Payer: Self-pay | Admitting: Obstetrics and Gynecology

## 2015-08-12 DIAGNOSIS — Z1231 Encounter for screening mammogram for malignant neoplasm of breast: Secondary | ICD-10-CM

## 2015-08-31 ENCOUNTER — Ambulatory Visit (HOSPITAL_COMMUNITY)
Admission: RE | Admit: 2015-08-31 | Discharge: 2015-08-31 | Disposition: A | Discharge status: Home / Self Care | Payer: BLUE CROSS/BLUE SHIELD | Source: Ambulatory Visit | Attending: Obstetrics and Gynecology | Admitting: Obstetrics and Gynecology

## 2015-08-31 DIAGNOSIS — Z1231 Encounter for screening mammogram for malignant neoplasm of breast: Secondary | ICD-10-CM

## 2015-09-27 ENCOUNTER — Telehealth: Payer: Self-pay | Admitting: Family Medicine

## 2015-09-27 ENCOUNTER — Ambulatory Visit (INDEPENDENT_AMBULATORY_CARE_PROVIDER_SITE_OTHER): Payer: BLUE CROSS/BLUE SHIELD | Admitting: *Deleted

## 2015-09-27 DIAGNOSIS — Z23 Encounter for immunization: Secondary | ICD-10-CM | POA: Diagnosis not present

## 2015-09-27 NOTE — Telephone Encounter (Signed)
Left detailed message stating blood work would be ordered.

## 2015-10-02 ENCOUNTER — Other Ambulatory Visit: Payer: BLUE CROSS/BLUE SHIELD

## 2015-10-02 DIAGNOSIS — Z0000 Encounter for general adult medical examination without abnormal findings: Secondary | ICD-10-CM

## 2015-10-04 LAB — ANEMIA PROFILE B
Basophils Absolute: 0 10*3/uL (ref 0.0–0.2)
Basos: 1 %
EOS (ABSOLUTE): 0.4 10*3/uL (ref 0.0–0.4)
EOS: 7 %
FERRITIN: 47 ng/mL (ref 15–150)
FOLATE: 15 ng/mL (ref >3.0)
HEMATOCRIT: 38.9 % (ref 34.0–46.6)
HEMOGLOBIN: 12.9 g/dL (ref 11.1–15.9)
IRON SATURATION: 30 % (ref 15–55)
Immature Grans (Abs): 0 10*3/uL (ref 0.0–0.1)
Immature Granulocytes: 0 %
Iron: 88 ug/dL (ref 27–159)
Lymphocytes Absolute: 1.8 10*3/uL (ref 0.7–3.1)
Lymphs: 37 %
MCH: 30.8 pg (ref 26.6–33.0)
MCHC: 33.2 g/dL (ref 31.5–35.7)
MCV: 93 fL (ref 79–97)
MONOS ABS: 0.4 10*3/uL (ref 0.1–0.9)
Monocytes: 8 %
NEUTROS ABS: 2.4 10*3/uL (ref 1.4–7.0)
Neutrophils: 47 %
Platelets: 235 10*3/uL (ref 150–379)
RBC: 4.19 x10E6/uL (ref 3.77–5.28)
RDW: 13.3 % (ref 12.3–15.4)
Retic Ct Pct: 0.5 % — ABNORMAL LOW (ref 0.6–2.6)
Total Iron Binding Capacity: 290 ug/dL (ref 250–450)
UIBC: 202 ug/dL (ref 131–425)
Vitamin B-12: 537 pg/mL (ref 211–946)
WBC: 5 10*3/uL (ref 3.4–10.8)

## 2015-10-04 LAB — HEPATIC FUNCTION PANEL
ALT: 20 IU/L (ref 0–32)
AST: 22 IU/L (ref 0–40)
Albumin: 4.3 g/dL (ref 3.5–5.5)
Alkaline Phosphatase: 53 IU/L (ref 39–117)
BILIRUBIN TOTAL: 0.7 mg/dL (ref 0.0–1.2)
BILIRUBIN, DIRECT: 0.14 mg/dL (ref 0.00–0.40)
Total Protein: 7 g/dL (ref 6.0–8.5)

## 2015-10-04 LAB — BMP8+EGFR
BUN/Creatinine Ratio: 27 — ABNORMAL HIGH (ref 9–23)
BUN: 21 mg/dL (ref 6–24)
CALCIUM: 9.6 mg/dL (ref 8.7–10.2)
CO2: 23 mmol/L (ref 18–29)
Chloride: 103 mmol/L (ref 97–108)
Creatinine, Ser: 0.78 mg/dL (ref 0.57–1.00)
GFR calc non Af Amer: 86 mL/min/{1.73_m2} (ref >59)
GFR, EST AFRICAN AMERICAN: 100 mL/min/{1.73_m2} (ref >59)
Glucose: 85 mg/dL (ref 65–99)
Potassium: 4.4 mmol/L (ref 3.5–5.2)
Sodium: 143 mmol/L (ref 134–144)

## 2015-10-04 LAB — THYROID PANEL WITH TSH
Free Thyroxine Index: 2 (ref 1.2–4.9)
T3 Uptake Ratio: 28 % (ref 24–39)
T4, Total: 7.2 ug/dL (ref 4.5–12.0)
TSH: 2.24 u[IU]/mL (ref 0.450–4.500)

## 2015-10-04 LAB — VITAMIN D 25 HYDROXY (VIT D DEFICIENCY, FRACTURES): Vit D, 25-Hydroxy: 43.2 ng/mL (ref 30.0–100.0)

## 2015-10-05 ENCOUNTER — Encounter: Payer: Self-pay | Admitting: Family Medicine

## 2015-10-05 ENCOUNTER — Other Ambulatory Visit: Payer: Self-pay | Admitting: *Deleted

## 2015-10-05 DIAGNOSIS — E7800 Pure hypercholesterolemia, unspecified: Secondary | ICD-10-CM

## 2015-10-06 ENCOUNTER — Other Ambulatory Visit (INDEPENDENT_AMBULATORY_CARE_PROVIDER_SITE_OTHER): Payer: BLUE CROSS/BLUE SHIELD

## 2015-10-06 DIAGNOSIS — E7800 Pure hypercholesterolemia, unspecified: Secondary | ICD-10-CM

## 2015-10-06 NOTE — Progress Notes (Signed)
Lab only 

## 2015-10-07 LAB — LIPID PANEL
CHOL/HDL RATIO: 4.6 ratio — AB (ref 0.0–4.4)
CHOLESTEROL TOTAL: 220 mg/dL — AB (ref 100–199)
HDL: 48 mg/dL (ref >39)
LDL Calculated: 155 mg/dL — ABNORMAL HIGH (ref 0–99)
TRIGLYCERIDES: 84 mg/dL (ref 0–149)
VLDL Cholesterol Cal: 17 mg/dL (ref 5–40)

## 2015-10-08 ENCOUNTER — Other Ambulatory Visit: Payer: Self-pay | Admitting: Family Medicine

## 2015-10-08 ENCOUNTER — Encounter: Payer: Self-pay | Admitting: Family Medicine

## 2015-10-08 ENCOUNTER — Ambulatory Visit (INDEPENDENT_AMBULATORY_CARE_PROVIDER_SITE_OTHER): Payer: BLUE CROSS/BLUE SHIELD

## 2015-10-08 ENCOUNTER — Ambulatory Visit (INDEPENDENT_AMBULATORY_CARE_PROVIDER_SITE_OTHER): Payer: BLUE CROSS/BLUE SHIELD | Admitting: Family Medicine

## 2015-10-08 VITALS — BP 106/57 | HR 75 | Temp 97.4°F | Ht 63.0 in | Wt 143.0 lb

## 2015-10-08 DIAGNOSIS — Z0000 Encounter for general adult medical examination without abnormal findings: Secondary | ICD-10-CM | POA: Diagnosis not present

## 2015-10-08 DIAGNOSIS — E559 Vitamin D deficiency, unspecified: Secondary | ICD-10-CM

## 2015-10-08 DIAGNOSIS — Z780 Asymptomatic menopausal state: Secondary | ICD-10-CM | POA: Diagnosis not present

## 2015-10-08 DIAGNOSIS — E7800 Pure hypercholesterolemia, unspecified: Secondary | ICD-10-CM

## 2015-10-08 DIAGNOSIS — E785 Hyperlipidemia, unspecified: Secondary | ICD-10-CM

## 2015-10-08 MED ORDER — ROSUVASTATIN CALCIUM 5 MG PO TABS: 5.0000 mg | ORAL_TABLET | Freq: Every day | ORAL | Status: DC

## 2015-10-08 NOTE — Progress Notes (Signed)
Subjective:    Patient ID: DEEDRA PRO, female    DOB: 16-Sep-1961, 54 y.o.   MRN: [id number]  HPI Patient is here today for annual wellness exam and follow up of chronic medical problems which includes hyperlipidemia. She is taking medications regularly. The patient today complains of some generalized arthralgias. She also has some issues about hormone replacement. She is due to get a DEXA scan and will be given an FOBT to return she has had her lab work and we will review that with her during the visit today. Patient denies chest pain shortness of breath trouble swallowing heartburn indigestion nausea vomiting diarrhea or blood in the stool. She is passing her water without problems. She is up-to-date on her colonoscopy. She has had her pelvic exam and mammogram and everything was good. She is stopping her hormones at this time. She knows that she may experience some hot flashes and mood swings. In this conversation we suggested some black coash. She has tried this in the past and she felt like this gave her a headache. She is also up-to-date on her eye exams.      Patient Active Problem List   Diagnosis Date Noted  . Migraine 03/19/2015  . Vitamin D deficiency 04/29/2014  . Current use of estrogen therapy 04/29/2014  . Hx of migraines 04/03/2013  . Elevated LDL cholesterol level 04/03/2013  . Special screening for malignant neoplasms, colon 09/18/2011  . Diverticulosis of colon (without mention of hemorrhage) 09/18/2011  . Melanosis coli 09/18/2011   Outpatient Encounter Prescriptions as of 10/08/2015  Medication Sig  . b complex vitamins capsule Take 1 capsule by mouth daily.  . Cholecalciferol (VITAMIN D PO) Take 2,000 Units by mouth daily.   . Flaxseed, Linseed, (FLAXSEED OIL) 1200 MG CAPS Take 1,200 mg by mouth daily.  . SUMAtriptan (IMITREX) 100 MG tablet Take 1 tablet (100 mg total) by mouth 2 (two) times daily as needed for migraine. Do not exceed 2 tabs per 24 hours  (Patient not taking: Reported on 10/08/2015)  . [DISCONTINUED] estradiol (ESTRACE) 0.5 MG tablet Take 0.25 mg by mouth daily.   . [DISCONTINUED] RESTASIS 0.05 % ophthalmic emulsion Place 0.05 drops into both eyes as needed.   No facility-administered encounter medications on file as of 10/08/2015.      Review of Systems  Constitutional: Negative.   HENT: Negative.   Eyes: Negative.   Respiratory: Negative.   Cardiovascular: Negative.   Gastrointestinal: Negative.   Endocrine: Negative.   Genitourinary: Negative.        Discuss hormones  Musculoskeletal: Positive for arthralgias.  Skin: Negative.   Allergic/Immunologic: Negative.   Neurological: Negative.   Hematological: Negative.   Psychiatric/Behavioral: Negative.        Objective:   Physical Exam  Constitutional: She is oriented to person, place, and time. She appears well-developed and well-nourished. No distress.  HENT:  Head: Normocephalic and atraumatic.  Right Ear: External ear normal.  Left Ear: External ear normal.  Mouth/Throat: Oropharynx is clear and moist.  Nasal congestion bilaterally  Eyes: Conjunctivae and EOM are normal. Pupils are equal, round, and reactive to light. Right eye exhibits no discharge. Left eye exhibits no discharge. No scleral icterus.  Neck: Normal range of motion. Neck supple. No thyromegaly present.  Without bruits or thyromegaly  Cardiovascular: Normal rate, regular rhythm, normal heart sounds and intact distal pulses.   No murmur heard. The heart has a regular rate and rhythm at 72/m  Pulmonary/Chest: Effort normal and  breath sounds normal. No respiratory distress. She has no wheezes. She has no rales. She exhibits no tenderness.  Clear anteriorly and posteriorly  Abdominal: Soft. Bowel sounds are normal. She exhibits no mass. There is no tenderness. There is no rebound and no guarding.  No masses tenderness or organ enlargement or inguinal adenopathy  Musculoskeletal: Normal range  of motion. She exhibits no edema or tenderness.  Lymphadenopathy:    She has no cervical adenopathy.  Neurological: She is alert and oriented to person, place, and time. She has normal reflexes. No cranial nerve deficit.  Skin: Skin is warm and dry. No rash noted.  Psychiatric: She has a normal mood and affect. Her behavior is normal. Judgment and thought content normal.  Nursing note and vitals reviewed.   BP 106/57 mmHg  Pulse 75  Temp(Src) 97.4 F (36.3 C) (Oral)  Ht 5\' 3"  (1.6 m)  Wt 143 lb (64.864 kg)  BMI 25.34 kg/m2       Assessment & Plan:  1. Elevated LDL cholesterol level -Start Crestor 5 mg 1 daily at bedtime -Recheck liver function tests in 4 weeks -Recheck lipid liver panel in 3 months  2. Hyperlipidemia -Follow above directions - Hepatic function panel; Future - Lipid panel; Future - Hepatic function panel; Future  3. Vitamin D deficiency -Patient will increase her vitamin D intake 4000 on Saturday and Sunday and 2000 Monday through Friday  4. Annual physical exam -Patient is up-to-date on all of her health care parameters other than getting the Prevnar vaccine and she will check with her insurance on that. She will also get a DEXA scan today and we will call her with these results as soon as they become available   Meds ordered this encounter  Medications  . rosuvastatin (CRESTOR) 5 MG tablet    Sig: Take 1 tablet (5 mg total) by mouth daily.    Dispense:  90 tablet    Refill:  3   Patient Instructions  Continue current medications. Continue good therapeutic lifestyle changes which include good diet and exercise. Fall precautions discussed with patient. If an FOBT was given today- please return it to our front desk. If you are over 20 years old - you may need Prevnar 88 or the adult Pneumonia vaccine.  **Flu shots are available--- please call and schedule a FLU-CLINIC appointment**  After your visit with [place] today you will receive a survey in the  mail or online from [employer] regarding your care with [place]. Please take a moment to fill this out. Your feedback is very important to [place] as you can help [place] better understand your patient needs as well as improve your experience and satisfaction. WE CARE ABOUT YOU!!!    Continue to follow-up aggressive therapeutic lifestyle changes which include his diet and exercise Take the low-dose Crestor 5 mg 1 at bedtime Come by for liver function tests in 4 weeks Repeat a lipid liver panel in 3 months fasting This winter drink plenty of fluids and keep the house as cool as possible Use nasal saline for head congestion and take Mucinex if needed for cough and congestion Take Tylenol for arthralgias aches and pains We will call you with the results of the DEXA scan as soon as that becomes available The patient should check with her insurance regarding the Prevnar vaccine   Arrie Senate MD

## 2015-10-08 NOTE — Patient Instructions (Addendum)
Continue current medications. Continue good therapeutic lifestyle changes which include good diet and exercise. Fall precautions discussed with patient. If an FOBT was given today- please return it to our front desk. If you are over 54 years old - you may need Prevnar 68 or the adult Pneumonia vaccine.  **Flu shots are available--- please call and schedule a FLU-CLINIC appointment**  After your visit with [place] today you will receive a survey in the mail or online from [employer] regarding your care with [place]. Please take a moment to fill this out. Your feedback is very important to [place] as you can help [place] better understand your patient needs as well as improve your experience and satisfaction. WE CARE ABOUT YOU!!!    Continue to follow-up aggressive therapeutic lifestyle changes which include his diet and exercise Take the low-dose Crestor 5 mg 1 at bedtime Come by for liver function tests in 4 weeks Repeat a lipid liver panel in 3 months fasting This winter drink plenty of fluids and keep the house as cool as possible Use nasal saline for head congestion and take Mucinex if needed for cough and congestion Take Tylenol for arthralgias aches and pains We will call you with the results of the DEXA scan as soon as that becomes available The patient should check with her insurance regarding the Prevnar vaccine

## 2015-10-11 ENCOUNTER — Ambulatory Visit (INDEPENDENT_AMBULATORY_CARE_PROVIDER_SITE_OTHER): Payer: BLUE CROSS/BLUE SHIELD | Admitting: Pharmacist

## 2015-10-11 VITALS — Ht 63.0 in | Wt 143.0 lb

## 2015-10-11 DIAGNOSIS — M8580 Other specified disorders of bone density and structure, unspecified site: Secondary | ICD-10-CM | POA: Diagnosis not present

## 2015-10-11 NOTE — Progress Notes (Signed)
Patient ID: Carla Moreno, female   DOB: Jan 26, 1961, 54 y.o.   MRN: [id number]  [facility] Current Height: Height: 5\' 3"  (160 cm)      Max Lifetime Height:  5\' 3"  Current Weight: Weight: 143 lb (64.864 kg)       Ethnicity:Caucasian     HPI: Does pt already have a diagnosis of:  Osteopenia?  No Osteoporosis?  No  Back Pain?  No       Kyphosis?  No Prior fracture?  No Med(s) for Osteoporosis/Osteopenia:  none Med(s) previously tried for Osteoporosis/Osteopenia:  none                                                             PMH: Age at menopause:  Currently perimenopausal Hysterectomy?  No Oophorectomy?  No HRT? Yes - Former.  Type/duration: estrogen Steroid Use?  No Thyroid med?  No History of cancer?  No History of digestive disorders (ie Crohn's)?  No Current or previous eating disorders?  No Last Vitamin D Result:  43.2 (10/02/2015) Last GFR Result:  86 (10/02/2015)   FH/SH: Family history of osteoporosis?  No Parent with history of hip fracture?  No Family history of breast cancer?  Yes paternal aunt Exercise?  Yes - exercise class 3 times per week Smoking?  No Alcohol?  Yes - weekend only    Calcium Assessment Calcium Intake  # of servings/day  Calcium mg  Milk (8 oz) 0  x  300  = 0  Yogurt (4 oz) 1 x  200 = 200mg   Cheese (1 oz) 1 x  200 = 200mg   Other Calcium sources   250mg   Ca supplement 0 = 0   Estimated calcium intake per day 650mg     DEXA Results Date of Test T-Score for AP Spine L1-L4 T-Score for Total Left Hip T-Score for Total Right Hip  10/08/2015 -0.8 -1.1 -1.3                 Lowest T- Score = -2.3 at neck of right hip  FRAX 10 year estimate: Total FX risk:  8.3%  (consider medication if >/= 20%) Hip FX risk:  1.4%  (consider medication if >/= 3%)  Assessment: osteopenia with low fracture risk per FRAX estimate  Recommendations: 1.   Discussed BMD  / DEXA results and discussed fracture risk. 2.  recommend calcium 1200mg   daily through supplementation or diet.  3.  continue weight bearing exercise - 30 minutes at least 4 days per week.   4.  Counseled and educated about fall risk and prevention.  Recheck DEXA:  2 years  Time spent counseling patient:  20 minutes   Cherre Robins, PharmD, CPP

## 2015-11-01 ENCOUNTER — Encounter: Payer: Self-pay | Admitting: Family Medicine

## 2016-01-27 ENCOUNTER — Telehealth: Payer: Self-pay | Admitting: *Deleted

## 2016-01-27 NOTE — Telephone Encounter (Signed)
Received fax [employer] for mail order request for sumatriptan 90 day supply.  Is this ok?

## 2016-01-27 NOTE — Telephone Encounter (Signed)
yes

## 2016-01-28 ENCOUNTER — Other Ambulatory Visit: Payer: Self-pay | Admitting: *Deleted

## 2016-01-28 MED ORDER — SUMATRIPTAN SUCCINATE 100 MG PO TABS: 100.0000 mg | ORAL_TABLET | Freq: Two times a day (BID) | ORAL | Status: DC | PRN

## 2016-01-28 NOTE — Telephone Encounter (Signed)
Done

## 2016-02-21 ENCOUNTER — Encounter: Payer: Self-pay | Admitting: Nurse Practitioner

## 2016-03-28 ENCOUNTER — Ambulatory Visit: Payer: 59 | Admitting: Nurse Practitioner

## 2016-04-05 ENCOUNTER — Ambulatory Visit: Payer: BLUE CROSS/BLUE SHIELD | Admitting: Nurse Practitioner

## 2016-04-07 ENCOUNTER — Ambulatory Visit: Payer: BLUE CROSS/BLUE SHIELD | Admitting: Family Medicine

## 2016-05-18 ENCOUNTER — Telehealth: Payer: Self-pay | Admitting: *Deleted

## 2016-05-18 NOTE — Telephone Encounter (Signed)
Message For: OFFICE               Taken  1-JUN-17 at  1:25PM by DEF ------------------------------------------------------------ Gabriel Earing                       CID [id number]  Patient Carla Moreno     Pt's Dr Hassell Done       Area Code 336 Phone# [phone]     RE CXL 415PM 6/5 APPT WCB TO RESCHEDULE                                                                   Disp:Y/N N If Y = C/B If No Response In 56minutes ============================================================

## 2016-05-22 ENCOUNTER — Ambulatory Visit: Payer: BLUE CROSS/BLUE SHIELD | Admitting: Nurse Practitioner

## 2016-09-06 ENCOUNTER — Other Ambulatory Visit: Payer: Self-pay | Admitting: Obstetrics and Gynecology

## 2016-09-06 DIAGNOSIS — Z1231 Encounter for screening mammogram for malignant neoplasm of breast: Secondary | ICD-10-CM

## 2016-09-07 ENCOUNTER — Encounter: Payer: Self-pay | Admitting: Nurse Practitioner

## 2016-09-07 ENCOUNTER — Ambulatory Visit (INDEPENDENT_AMBULATORY_CARE_PROVIDER_SITE_OTHER): Payer: BLUE CROSS/BLUE SHIELD | Admitting: Nurse Practitioner

## 2016-09-07 VITALS — BP 124/56 | HR 71 | Ht 63.0 in | Wt 143.0 lb

## 2016-09-07 DIAGNOSIS — G43909 Migraine, unspecified, not intractable, without status migrainosus: Secondary | ICD-10-CM

## 2016-09-07 MED ORDER — SUMATRIPTAN SUCCINATE 100 MG PO TABS: 100.0000 mg | ORAL_TABLET | ORAL | 3 refills | Status: DC | PRN

## 2016-09-07 NOTE — Patient Instructions (Signed)
Continue Imitrex acutely for migraine refill for one year Call for increase in headaches Follow-up yearly and when necessary 

## 2016-09-07 NOTE — Progress Notes (Signed)
I have read the note, and I agree with the clinical assessment and plan.  WILLIS,CHARLES KEITH   

## 2016-09-07 NOTE — Progress Notes (Signed)
[facility]  PATIENT: Carla Moreno DOB: Dec 07, 1961   REASON FOR VISIT: Follow-up for migraines HISTORY FROM: Patient    HISTORY OF PRESENT ILLNESS:Carla Moreno is a 55 year old right-handed white female with a history of migraine headaches. She is having about 1 migraine per month relieved with Imitrex acutely The patient is on estradiol for medical symptoms, and she believes that this may have helped her headaches as well. The patient indicates that weather changes may bring on the headache. Carla Moreno is aware of migraine triggers and tries to avoid those. She continues  To be very active and  Exercises.The patient returns to this office for an evaluation.   REVIEW OF SYSTEMS: Full 14 system review of systems performed and notable only for those listed, all others are neg:  Constitutional: neg  Cardiovascular: neg Ear/Nose/Throat: neg  Skin: neg Eyes: neg Respiratory: neg Gastroitestinal: neg  Hematology/Lymphatic: easy bruising  Endocrine: neg Musculoskeletal: Allergy/Immunology: neg Neurological: neg Psychiatric: neg Sleep : neg   ALLERGIES: No Known Allergies  HOME MEDICATIONS: Outpatient Medications Prior to Visit  Medication Sig Dispense Refill  . b complex vitamins capsule Take 1 capsule by mouth daily.    . Cholecalciferol (VITAMIN D PO) Take 2,000 Units by mouth daily.     . SUMAtriptan (IMITREX) 100 MG tablet Take 1 tablet (100 mg total) by mouth 2 (two) times daily as needed for migraine. Do not exceed 2 tabs per 24 hours 27 tablet 1  . Flaxseed, Linseed, (FLAXSEED OIL) 1200 MG CAPS Take 1,200 mg by mouth daily.    . rosuvastatin (CRESTOR) 5 MG tablet Take 1 tablet (5 mg total) by mouth daily. (Patient not taking: Reported on 09/07/2016) 90 tablet 3   No facility-administered medications prior to visit.     PAST MEDICAL HISTORY: Past Medical History:  Diagnosis Date  . Hyperlipidemia   . Migraines     PAST SURGICAL  HISTORY: Past Surgical History:  Procedure Laterality Date  . ABDOMINAL HYSTERECTOMY  2001  . CESAREAN SECTION     2 occasions    FAMILY HISTORY: Family History  Problem Relation Age of Onset  . Pneumonia Mother   . Migraines Neg Hx   . Hyperlipidemia Paternal Grandmother     diet controlled    SOCIAL HISTORY: Social History   Social History  . Marital status: Married    Spouse name: N/A  . Number of children: N/A  . Years of education: N/A   Occupational History  . Not on file.   Social History Main Topics  . Smoking status: Former Smoker    Packs/day: 0.50    Types: Cigarettes    Start date: 12/18/1976    Quit date: 12/18/1986  . Smokeless tobacco: Never Used  . Alcohol use 1.2 oz/week    2 Glasses of wine per week  . Drug use: No  . Sexual activity: Not on file   Other Topics Concern  . Not on file   Social History Narrative  . No narrative on file     PHYSICAL EXAM  Vitals:   09/07/16 1504  BP: (!) 124/56  Pulse: 71  Weight: 143 lb (64.9 kg)  Height: 5\' 3"  (1.6 m)   Body mass index is 25.33 kg/m.  Generalized: Well developed, in no acute distress  Neurological examination   Mentation: Alert oriented to time, place, history taking. Attention span and concentration appropriate. Recent and remote memory intact.  Follows all commands speech and language fluent.  Cranial nerve II-XII: Pupils were equal round reactive to light extraocular movements were full, visual field were full on confrontational test. Facial sensation and strength were normal. hearing was intact to finger rubbing bilaterally. Uvula tongue midline. head turning and shoulder shrug were normal and symmetric.Tongue protrusion into cheek strength was normal. Motor: normal bulk and tone, full strength in the BUE, BLE, fine finger movements normal, no pronator drift. No focal weakness Coordination: finger-nose-finger, heel-to-shin bilaterally, no dysmetria Reflexes: Brachioradialis 2/2,  biceps 2/2, triceps 2/2, patellar 2/2, Achilles 2/2, plantar responses were flexor bilaterally. Gait and Station: Rising up from seated position without assistance, normal stance,  moderate stride, good arm swing, smooth turning, able to perform tiptoe, and heel walking without difficulty. Tandem gait is steady  DIAGNOSTIC DATA (LABS, IMAGING, TESTING)   ASSESSMENT AND PLAN  55 y.o. year old female  has a past medical history of Migraines here to follow-up. She remains on Imitrex acutely.  Continue Imitrex acutely for migraine refill for one year Call for increase in headaches Follow-up yearly and when necessary Carla Moreno, Carla Moreno, Carla [facility], Carla Moreno  [facility] [address] [place] More[place], [place] [address] [phone]

## 2016-09-11 ENCOUNTER — Ambulatory Visit
Admission: RE | Admit: 2016-09-11 | Discharge: 2016-09-11 | Disposition: A | Discharge status: Home / Self Care | Payer: BLUE CROSS/BLUE SHIELD | Source: Ambulatory Visit | Attending: Obstetrics and Gynecology | Admitting: Obstetrics and Gynecology

## 2016-09-11 DIAGNOSIS — Z1231 Encounter for screening mammogram for malignant neoplasm of breast: Secondary | ICD-10-CM

## 2016-09-12 ENCOUNTER — Telehealth: Payer: Self-pay | Admitting: *Deleted

## 2016-09-12 NOTE — Telephone Encounter (Signed)
PA approval sumatriptan 100mg  tabs. Effective 09-12-16 thru 12-17-2038.  ID# [id number]  [employer] [phone].  Ssy.   Spoke to pharmacy and let them know. ([place]).

## 2016-09-18 ENCOUNTER — Ambulatory Visit (INDEPENDENT_AMBULATORY_CARE_PROVIDER_SITE_OTHER): Payer: BLUE CROSS/BLUE SHIELD

## 2016-09-18 DIAGNOSIS — Z23 Encounter for immunization: Secondary | ICD-10-CM

## 2016-10-09 ENCOUNTER — Ambulatory Visit (INDEPENDENT_AMBULATORY_CARE_PROVIDER_SITE_OTHER): Payer: BLUE CROSS/BLUE SHIELD

## 2016-10-09 ENCOUNTER — Encounter: Payer: Self-pay | Admitting: Family Medicine

## 2016-10-09 ENCOUNTER — Ambulatory Visit (INDEPENDENT_AMBULATORY_CARE_PROVIDER_SITE_OTHER): Payer: BLUE CROSS/BLUE SHIELD | Admitting: Family Medicine

## 2016-10-09 VITALS — BP 109/69 | HR 66 | Temp 97.0°F | Ht 63.0 in | Wt 141.0 lb

## 2016-10-09 DIAGNOSIS — M79641 Pain in right hand: Secondary | ICD-10-CM | POA: Diagnosis not present

## 2016-10-09 DIAGNOSIS — M79642 Pain in left hand: Secondary | ICD-10-CM

## 2016-10-09 DIAGNOSIS — Z0000 Encounter for general adult medical examination without abnormal findings: Secondary | ICD-10-CM

## 2016-10-09 DIAGNOSIS — E785 Hyperlipidemia, unspecified: Secondary | ICD-10-CM | POA: Insufficient documentation

## 2016-10-09 DIAGNOSIS — E7800 Pure hypercholesterolemia, unspecified: Secondary | ICD-10-CM | POA: Diagnosis not present

## 2016-10-09 DIAGNOSIS — E559 Vitamin D deficiency, unspecified: Secondary | ICD-10-CM

## 2016-10-09 LAB — URINALYSIS, COMPLETE
Bilirubin, UA: NEGATIVE
Glucose, UA: NEGATIVE
Ketones, UA: NEGATIVE
Nitrite, UA: NEGATIVE
Protein, UA: NEGATIVE
Specific Gravity, UA: 1.01 (ref 1.005–1.030)
Urobilinogen, Ur: 0.2 mg/dL (ref 0.2–1.0)
pH, UA: 5.5 (ref 5.0–7.5)

## 2016-10-09 LAB — MICROSCOPIC EXAMINATION: Epithelial Cells (non renal): >10 /hpf — AB (ref 0–10)

## 2016-10-09 NOTE — Patient Instructions (Addendum)
Continue current medications. Continue good therapeutic lifestyle changes which include good diet and exercise. Fall precautions discussed with patient. If an FOBT was given today- please return it to our front desk. If you are over 55 years old - you may need Prevnar 39 or the adult Pneumonia vaccine.  **Flu shots are available--- please call and schedule a FLU-CLINIC appointment**  After your visit with [place] today you will receive a survey in the mail or online from [employer] regarding your care with [place]. Please take a moment to fill this out. Your feedback is very important to [place] as you can help [place] better understand your patient needs as well as improve your experience and satisfaction. WE CARE ABOUT YOU!!!   Do not forget to get your pelvic exam Continue with annual mammograms Continue with exercise regimen

## 2016-10-09 NOTE — Addendum Note (Signed)
Addended by: Zannie Cove on: 10/09/2016 03:48 PM   Modules accepted: Orders

## 2016-10-09 NOTE — Progress Notes (Addendum)
Subjective:    Patient ID: Carla Moreno, female    DOB: May 15, 1961, 55 y.o.   MRN: [id number]  HPI Pt here for annual follow up and management of chronic medical problems which includes hyperlipidemia. She is taking medications regularly.The patient is doing well overall. She hurts in both the first metacarpal phalangeal joint area with the right hand being worse than the left. She uses her hands a lot at work. This is been going on for several months. She denies any chest pain or palpitations or pressure. She denies any shortness of breath. She exercises regularly. She denies any problems as far as swallowing heartburn indigestion nausea vomiting diarrhea or blood in the stool. She does have a history of diverticulosis but is having no problems with diverticulitis. She is up-to-date on her colonoscopy. She is passing her water without problems with no burning pain or frequency. She just got her eyes exam and the glasses were changed. She is due to get her pelvic exam with Dr. Lannette Donath and she's had her mammogram which was normal. The patient is the only child. Her father is still living and does have a history of hypertension and elevated cholesterol. Her grandmother died of old age and there seems to be a lot of long livers in her grandparents.     Patient Active Problem List   Diagnosis Date Noted  . Hyperlipidemia 10/09/2016  . Osteopenia 10/11/2015  . Migraine 03/19/2015  . Vitamin D deficiency 04/29/2014  . Current use of estrogen therapy 04/29/2014  . Hx of migraines 04/03/2013  . Elevated LDL cholesterol level 04/03/2013  . Special screening for malignant neoplasms, colon 09/18/2011  . Diverticulosis of colon (without mention of hemorrhage) 09/18/2011  . Melanosis coli 09/18/2011   Outpatient Encounter Prescriptions as of 10/09/2016  Medication Sig  . b complex vitamins capsule Take 1 capsule by mouth daily.  . Cholecalciferol (VITAMIN D PO) Take 2,000 Units by mouth daily.     . Cinnamon 500 MG capsule Take 500 mg by mouth daily.  . SUMAtriptan (IMITREX) 100 MG tablet Take 1 tablet (100 mg total) by mouth every 2 (two) hours as needed for migraine. Do not exceed 2 tabs per 24 hours   No facility-administered encounter medications on file as of 10/09/2016.       Review of Systems  Constitutional: Negative.   HENT: Negative.   Eyes: Negative.   Respiratory: Negative.   Cardiovascular: Negative.   Gastrointestinal: Negative.   Endocrine: Negative.   Genitourinary: Negative.   Musculoskeletal: Positive for arthralgias (hand pain).  Skin: Negative.   Allergic/Immunologic: Negative.   Neurological: Negative.   Hematological: Negative.   Psychiatric/Behavioral: Negative.        Objective:   Physical Exam  Constitutional: She is oriented to person, place, and time. She appears well-developed and well-nourished. No distress.  HENT:  Head: Normocephalic and atraumatic.  Right Ear: External ear normal.  Left Ear: External ear normal.  Nose: Nose normal.  Mouth/Throat: Oropharynx is clear and moist. No oropharyngeal exudate.  Eyes: Conjunctivae and EOM are normal. Pupils are equal, round, and reactive to light. Right eye exhibits no discharge. Left eye exhibits no discharge. No scleral icterus.  Neck: Normal range of motion. Neck supple. No thyromegaly present.  Without bruits or thyromegaly or adenopathy  Cardiovascular: Normal rate, regular rhythm, normal heart sounds and intact distal pulses.   No murmur heard. The heart has a regular rate and rhythm at 60/m  Pulmonary/Chest: Effort normal and breath  sounds normal. No respiratory distress. She has no wheezes. She has no rales.  Clear anteriorly and posteriorly  Abdominal: Soft. Bowel sounds are normal. She exhibits no mass. There is no tenderness. There is no rebound and no guarding.  No liver or spleen enlargement. No masses. No bruits. No inguinal adenopathy with good inguinal pulses and no suprapubic  tenderness  Musculoskeletal: Normal range of motion. She exhibits no edema.  There is tenderness at the radio-carpal joint, first joint on the right more than the left. No particular swelling in the joints bilaterally.  Lymphadenopathy:    She has no cervical adenopathy.  Neurological: She is alert and oriented to person, place, and time. She has normal reflexes. No cranial nerve deficit.  Skin: Skin is warm and dry. No rash noted.  Psychiatric: She has a normal mood and affect. Her behavior is normal. Judgment and thought content normal.  Nursing note and vitals reviewed.  BP 109/69 (BP Location: Left Arm)   Pulse 66   Temp 97 F (36.1 C) (Oral)   Ht _0  (1.6 m)   Wt 141 lb (64 kg)   BMI 24.98 kg/m   [facility] reading (PRIMARY) by  Dr. Brunilda Payor x-ray with results pending  EKG:. EKG with results pending-----EKG within normal limits and no change                                        Assessment & Plan:  1. Annual physical exam -The patient has had her mammogram and that was normal -She has an upcoming pelvic exam schedule - CBC with Differential/Platelet - BMP8+EGFR - Hepatic function panel - VITAMIN D 25 Hydroxy (Vit-D Deficiency, Fractures) - NMR, lipoprofile - Thyroid Panel With TSH - EKG 12-Lead - DG Chest 2 View; Future  2. Vitamin D deficiency -Continue vitamin D replacement pending results of lab work - CBC with Differential/Platelet - VITAMIN D 25 Hydroxy (Vit-D Deficiency, Fractures)  3. Pure hypercholesterolemia -Continue with aggressive therapeutic lifestyle changes. It is important to note the patient has tried Crestor in the past and this caused a lot of aching and she would prefer not to take that medicine again. - CBC with Differential/Platelet - BMP8+EGFR - Hepatic function panel - NMR, lipoprofile - EKG 12-Lead - DG Chest 2 View; Future  4. Bilateral hand pain -If she has a flare with her hands she should try taking Aleve 1 twice daily after  breakfast and supper for 7-10 days to see if this alleviates the discomfort. - DG Hand Complete Left; Future - DG Hand Complete Right; Future  Patient Instructions  Continue current medications. Continue good therapeutic lifestyle changes which include good diet and exercise. Fall precautions discussed with patient. If an FOBT was given today- please return it to our front desk. If you are over 8 years old - you may need Prevnar 3 or the adult Pneumonia vaccine.  **Flu shots are available--- please call and schedule a FLU-CLINIC appointment**  After your visit with [place] today you will receive a survey in the mail or online from [employer] regarding your care with [place]. Please take a moment to fill this out. Your feedback is very important to [place] as you can help [place] better understand your patient needs as well as improve your experience and satisfaction. WE CARE ABOUT YOU!!!   Do not forget to get your pelvic exam Continue with annual mammograms  Continue with exercise regimen  Arrie Senate MD

## 2016-10-10 LAB — CBC WITH DIFFERENTIAL/PLATELET
Basophils Absolute: 0.1 10*3/uL (ref 0.0–0.2)
Basos: 1 %
EOS (ABSOLUTE): 0.3 10*3/uL (ref 0.0–0.4)
Eos: 5 %
HEMATOCRIT: 37.1 % (ref 34.0–46.6)
HEMOGLOBIN: 12.4 g/dL (ref 11.1–15.9)
Immature Grans (Abs): 0 10*3/uL (ref 0.0–0.1)
Immature Granulocytes: 0 %
Lymphocytes Absolute: 2.3 10*3/uL (ref 0.7–3.1)
Lymphs: 39 %
MCH: 31.4 pg (ref 26.6–33.0)
MCHC: 33.4 g/dL (ref 31.5–35.7)
MCV: 94 fL (ref 79–97)
MONOCYTES: 8 %
Monocytes Absolute: 0.5 10*3/uL (ref 0.1–0.9)
Neutrophils Absolute: 2.8 10*3/uL (ref 1.4–7.0)
Neutrophils: 47 %
Platelets: 218 10*3/uL (ref 150–379)
RBC: 3.95 x10E6/uL (ref 3.77–5.28)
RDW: 13.5 % (ref 12.3–15.4)
WBC: 5.9 10*3/uL (ref 3.4–10.8)

## 2016-10-10 LAB — LIPID PANEL
CHOL/HDL RATIO: 4.1 ratio (ref 0.0–4.4)
Cholesterol, Total: 224 mg/dL — ABNORMAL HIGH (ref 100–199)
HDL: 54 mg/dL (ref >39)
LDL CALC: 149 mg/dL — AB (ref 0–99)
TRIGLYCERIDES: 106 mg/dL (ref 0–149)
VLDL Cholesterol Cal: 21 mg/dL (ref 5–40)

## 2016-10-10 LAB — VITAMIN D 25 HYDROXY (VIT D DEFICIENCY, FRACTURES): Vit D, 25-Hydroxy: 51.8 ng/mL (ref 30.0–100.0)

## 2016-10-10 LAB — BMP8+EGFR
BUN / CREAT RATIO: 18 (ref 9–23)
BUN: 10 mg/dL (ref 6–24)
CO2: 26 mmol/L (ref 18–29)
Calcium: 9.5 mg/dL (ref 8.7–10.2)
Chloride: 102 mmol/L (ref 96–106)
Creatinine, Ser: 0.56 mg/dL — ABNORMAL LOW (ref 0.57–1.00)
GFR calc non Af Amer: 105 mL/min/{1.73_m2} (ref >59)
GFR, EST AFRICAN AMERICAN: 121 mL/min/{1.73_m2} (ref >59)
Glucose: 81 mg/dL (ref 65–99)
Potassium: 4 mmol/L (ref 3.5–5.2)
SODIUM: 143 mmol/L (ref 134–144)

## 2016-10-10 LAB — THYROID PANEL WITH TSH
Free Thyroxine Index: 1.6 (ref 1.2–4.9)
T3 Uptake Ratio: 23 % — ABNORMAL LOW (ref 24–39)
T4, Total: 6.8 ug/dL (ref 4.5–12.0)
TSH: 2.08 u[IU]/mL (ref 0.450–4.500)

## 2016-10-10 LAB — HEPATIC FUNCTION PANEL
ALT: 19 IU/L (ref 0–32)
AST: 21 IU/L (ref 0–40)
Albumin: 4.5 g/dL (ref 3.5–5.5)
Alkaline Phosphatase: 61 IU/L (ref 39–117)
Bilirubin Total: 0.8 mg/dL (ref 0.0–1.2)
Bilirubin, Direct: 0.12 mg/dL (ref 0.00–0.40)
Total Protein: 7.2 g/dL (ref 6.0–8.5)

## 2016-10-13 ENCOUNTER — Telehealth: Payer: Self-pay | Admitting: Family Medicine

## 2016-10-13 NOTE — Telephone Encounter (Signed)
LM, call for results.

## 2016-10-16 ENCOUNTER — Other Ambulatory Visit: Payer: Self-pay

## 2016-10-16 ENCOUNTER — Encounter: Payer: Self-pay | Admitting: Family Medicine

## 2016-10-16 DIAGNOSIS — E7800 Pure hypercholesterolemia, unspecified: Secondary | ICD-10-CM

## 2016-10-16 MED ORDER — ATORVASTATIN CALCIUM 10 MG PO TABS: 10.0000 mg | ORAL_TABLET | Freq: Every day | ORAL | 1 refills | Status: DC

## 2017-01-11 ENCOUNTER — Other Ambulatory Visit: Payer: Self-pay | Admitting: Family Medicine

## 2017-04-12 ENCOUNTER — Telehealth: Payer: Self-pay | Admitting: Family Medicine

## 2017-04-12 NOTE — Telephone Encounter (Signed)
Pt states she stopped taking the statin 5-6 weeks ago and she has started taking fish oil and went on a diet and would like to just wait until her annual appt in October to have her cholesterol checked.

## 2017-08-21 ENCOUNTER — Telehealth: Payer: Self-pay | Admitting: Family Medicine

## 2017-08-22 MED ORDER — SCOPOLAMINE 1 MG/3DAYS TD PT72: 1.0000 | MEDICATED_PATCH | TRANSDERMAL | 0 refills | Status: DC

## 2017-08-22 NOTE — Telephone Encounter (Signed)
Transderm scopolamine for this patient is okay. #3 patches and use as directed

## 2017-08-22 NOTE — Telephone Encounter (Signed)
Rx sent and detailed message left for patient. 

## 2017-08-22 NOTE — Telephone Encounter (Signed)
Transderm scopolamine patches #3 and use as directed

## 2017-09-25 ENCOUNTER — Ambulatory Visit: Payer: BLUE CROSS/BLUE SHIELD | Admitting: Nurse Practitioner

## 2017-10-01 DIAGNOSIS — Z23 Encounter for immunization: Secondary | ICD-10-CM | POA: Diagnosis not present

## 2017-10-29 ENCOUNTER — Other Ambulatory Visit: Payer: Self-pay | Admitting: Obstetrics and Gynecology

## 2017-10-29 DIAGNOSIS — Z1231 Encounter for screening mammogram for malignant neoplasm of breast: Secondary | ICD-10-CM

## 2017-12-12 NOTE — Progress Notes (Signed)
[facility]  PATIENT: Carla Moreno DOB: 1961/07/29   REASON FOR VISIT: Follow-up for migraines HISTORY FROM: Patient    HISTORY OF PRESENT ILLNESS:Carla Moreno is a 56 year old right-handed white female with a history of migraine headaches. She is having about 1 migraine every few months relieved with Imitrex acutely. The patient indicates that weather changes may bring on the headache. Carla Moreno is aware of migraine triggers and tries to avoid those. She continues  To be very active and  Exercises.The patient returns to this office for an evaluation.   REVIEW OF SYSTEMS: Full 14 system review of systems performed and notable only for those listed, all others are neg:  Constitutional: neg  Cardiovascular: neg Ear/Nose/Throat: neg  Skin: neg Eyes: neg Respiratory: neg Gastroitestinal: neg  Hematology/Lymphatic: neg  Endocrine: neg Musculoskeletal: Allergy/Immunology: neg Neurological: History of migraines Psychiatric: neg Sleep : neg   ALLERGIES: No Known Allergies  HOME MEDICATIONS: Outpatient Medications Prior to Visit  Medication Sig Dispense Refill  . SUMAtriptan (IMITREX) 100 MG tablet Take 1 tablet (100 mg total) by mouth every 2 (two) hours as needed for migraine. Do not exceed 2 tabs per 24 hours 27 tablet 3  . atorvastatin (LIPITOR) 10 MG tablet Take 1 Tablet by mouth once daily (Patient not taking: Reported on 12/13/2017) 90 tablet 0  . b complex vitamins capsule Take 1 capsule by mouth daily.    . Cholecalciferol (VITAMIN D PO) Take 2,000 Units by mouth daily.     . Cinnamon 500 MG capsule Take 500 mg by mouth daily.    Marland Kitchen scopolamine (TRANSDERM-SCOP, 1.5 MG,) 1 MG/3DAYS Place 1 patch (1.5 mg total) onto the skin every 3 (three) days. 10 patch 0   No facility-administered medications prior to visit.     PAST MEDICAL HISTORY: Past Medical History:  Diagnosis Date  . Hyperlipidemia   . Migraines     PAST SURGICAL  HISTORY: Past Surgical History:  Procedure Laterality Date  . ABDOMINAL HYSTERECTOMY  2001  . CESAREAN SECTION     2 occasions    FAMILY HISTORY: Family History  Problem Relation Age of Onset  . Pneumonia Mother   . Hyperlipidemia Paternal Grandmother        diet controlled  . Migraines Neg Hx     SOCIAL HISTORY: Social History   Socioeconomic History  . Marital status: Married    Spouse name: Not on file  . Number of children: Not on file  . Years of education: Not on file  . Highest education level: Not on file  Social Needs  . Financial resource strain: Not on file  . Food insecurity - worry: Not on file  . Food insecurity - inability: Not on file  . Transportation needs - medical: Not on file  . Transportation needs - non-medical: Not on file  Occupational History  . Not on file  Tobacco Use  . Smoking status: Former Smoker    Packs/day: 0.50    Types: Cigarettes    Start date: 12/18/1976    Last attempt to quit: 12/18/1986    Years since quitting: 31.0  . Smokeless tobacco: Never Used  Substance and Sexual Activity  . Alcohol use: Yes    Alcohol/week: 1.2 oz    Types: 2 Glasses of wine per week  . Drug use: No  . Sexual activity: Not on file  Other Topics Concern  . Not on file  Social History Narrative  . Not on file  PHYSICAL EXAM  Vitals:   12/13/17 1521  BP: 108/60  Pulse: 72  Weight: 145 lb (65.8 kg)  Height: 5\' 3"  (1.6 m)   Body mass index is 25.69 kg/m.  Generalized: Well developed, in no acute distress  Neurological examination   Mentation: Alert oriented to time, place, history taking. Attention span and concentration appropriate. Recent and remote memory intact.  Follows all commands speech and language fluent.   Cranial nerve II-XII: Pupils were equal round reactive to light extraocular movements were full, visual field were full on confrontational test. Facial sensation and strength were normal. hearing was intact to finger  rubbing bilaterally. Uvula tongue midline. head turning and shoulder shrug were normal and symmetric.Tongue protrusion into cheek strength was normal. Motor: normal bulk and tone, full strength in the BUE, BLE, Coordination: finger-nose-finger, heel-to-shin bilaterally, no dysmetria Reflexes: Brachioradialis 2/2, biceps 2/2, triceps 2/2, patellar 2/2, Achilles 2/2, plantar responses were flexor bilaterally. Gait and Station: Rising up from seated position without assistance, normal stance,  moderate stride, good arm swing, smooth turning, able to perform tiptoe, and heel walking without difficulty. Tandem gait is steady  DIAGNOSTIC DATA (LABS, IMAGING, TESTING)   ASSESSMENT AND PLAN  57 y.o. year old female  has a past medical history of Migraines here to follow-up. She remains on Imitrex acutely.  She has a migraine about every 2-3 months.  Continue Imitrex acutely for migraine refill  Call for increase in headaches Follow-up yearly and when necessary Eliminate migraine triggers Dennie Bible, [facility], [facility], [facility] [address] [place], [place] [address] [phone]

## 2017-12-13 ENCOUNTER — Encounter: Payer: Self-pay | Admitting: Nurse Practitioner

## 2017-12-13 ENCOUNTER — Ambulatory Visit (INDEPENDENT_AMBULATORY_CARE_PROVIDER_SITE_OTHER): Payer: BLUE CROSS/BLUE SHIELD | Admitting: Nurse Practitioner

## 2017-12-13 ENCOUNTER — Ambulatory Visit
Admission: RE | Admit: 2017-12-13 | Discharge: 2017-12-13 | Disposition: A | Discharge status: Home / Self Care | Payer: BLUE CROSS/BLUE SHIELD | Source: Ambulatory Visit | Attending: Obstetrics and Gynecology | Admitting: Obstetrics and Gynecology

## 2017-12-13 VITALS — BP 108/60 | HR 72 | Ht 63.0 in | Wt 145.0 lb

## 2017-12-13 DIAGNOSIS — G43909 Migraine, unspecified, not intractable, without status migrainosus: Secondary | ICD-10-CM

## 2017-12-13 DIAGNOSIS — Z1231 Encounter for screening mammogram for malignant neoplasm of breast: Secondary | ICD-10-CM

## 2017-12-13 MED ORDER — SUMATRIPTAN SUCCINATE 100 MG PO TABS
100.0000 mg | ORAL_TABLET | ORAL | 1 refills | Status: DC | PRN
Start: 2017-12-13 — End: 2019-02-06

## 2017-12-13 NOTE — Patient Instructions (Signed)
Continue Imitrex acutely for migraine refill  Call for increase in headaches Follow-up yearly and when necessary

## 2017-12-13 NOTE — Progress Notes (Signed)
I have read the note, and I agree with the clinical assessment and plan.  Fontella Shan K Lujain Kraszewski   

## 2018-01-07 ENCOUNTER — Other Ambulatory Visit: Payer: Self-pay | Admitting: *Deleted

## 2018-01-07 ENCOUNTER — Telehealth: Payer: Self-pay | Admitting: Family Medicine

## 2018-01-07 DIAGNOSIS — Z0000 Encounter for general adult medical examination without abnormal findings: Secondary | ICD-10-CM

## 2018-01-07 DIAGNOSIS — E7800 Pure hypercholesterolemia, unspecified: Secondary | ICD-10-CM

## 2018-01-07 DIAGNOSIS — E559 Vitamin D deficiency, unspecified: Secondary | ICD-10-CM

## 2018-01-07 NOTE — Telephone Encounter (Signed)
Labs placed.

## 2018-01-21 ENCOUNTER — Encounter: Payer: Self-pay | Admitting: Family Medicine

## 2018-01-24 ENCOUNTER — Ambulatory Visit (INDEPENDENT_AMBULATORY_CARE_PROVIDER_SITE_OTHER): Payer: BLUE CROSS/BLUE SHIELD | Admitting: Family Medicine

## 2018-01-24 ENCOUNTER — Ambulatory Visit (INDEPENDENT_AMBULATORY_CARE_PROVIDER_SITE_OTHER): Payer: BLUE CROSS/BLUE SHIELD

## 2018-01-24 ENCOUNTER — Encounter: Payer: Self-pay | Admitting: Family Medicine

## 2018-01-24 VITALS — BP 119/68 | HR 70 | Temp 97.6°F | Ht 63.0 in | Wt 142.0 lb

## 2018-01-24 DIAGNOSIS — Z13820 Encounter for screening for osteoporosis: Secondary | ICD-10-CM

## 2018-01-24 DIAGNOSIS — M778 Other enthesopathies, not elsewhere classified: Secondary | ICD-10-CM

## 2018-01-24 DIAGNOSIS — Z780 Asymptomatic menopausal state: Secondary | ICD-10-CM

## 2018-01-24 DIAGNOSIS — Z0000 Encounter for general adult medical examination without abnormal findings: Secondary | ICD-10-CM | POA: Diagnosis not present

## 2018-01-24 DIAGNOSIS — M7581 Other shoulder lesions, right shoulder: Secondary | ICD-10-CM

## 2018-01-24 DIAGNOSIS — E559 Vitamin D deficiency, unspecified: Secondary | ICD-10-CM

## 2018-01-24 DIAGNOSIS — E7800 Pure hypercholesterolemia, unspecified: Secondary | ICD-10-CM

## 2018-01-24 MED ORDER — METHYLPREDNISOLONE ACETATE 80 MG/ML IJ SUSP
60.0000 mg | Freq: Once | INTRAMUSCULAR | Status: AC
  Administered 2018-01-24: 60 mg via INTRA_ARTICULAR

## 2018-01-24 NOTE — Progress Notes (Signed)
Subjective:    Patient ID: Carla Moreno, female    DOB: 1961/09/05, 57 y.o.   MRN: [id number]  HPI Pt here for follow up and management of chronic medical problems which includes hyperlipidemia. He is taking medication regularly.  Patient is having ongoing problems with being able to sleep due to right shoulder and arm pain that is dull in nature this been going on for a few months.  She has had her Pap smear and GYN checkup and a mammogram in December.  She will be given an FOBT to return and has had fasting lab work done today.  Her vital signs are stable and her weight is down 3 pounds since her last visit.  Patient did have an EKG a little over one year ago.  She also had a chest x-ray at that time.  The patient is doing well overall.  She is going to be a grandmother for the first time in March.  She denies any chest pain or shortness of breath.  She denies any trouble with her stomach including nausea vomiting diarrhea blood in the stool or black tarry bowel movements.  She is passing her water without problems.  Family history is positive and that her mother died of pneumonia and sepsis when she was in her 27s and her father is still living at 36 years of age.  There is cancer on the father's side but not the father.  She has no siblings.  She is up-to-date on her pelvic exams and mammograms and eye exams.     Patient Active Problem List   Diagnosis Date Noted  . Hyperlipidemia 10/09/2016  . Osteopenia 10/11/2015  . Migraine 03/19/2015  . Vitamin D deficiency 04/29/2014  . Current use of estrogen therapy 04/29/2014  . Hx of migraines 04/03/2013  . Elevated LDL cholesterol level 04/03/2013  . Special screening for malignant neoplasms, colon 09/18/2011  . Diverticulosis of colon (without mention of hemorrhage) 09/18/2011  . Melanosis coli 09/18/2011   Outpatient Encounter Medications as of 01/24/2018  Medication Sig  . SUMAtriptan (IMITREX) 100 MG tablet Take 1 tablet (100 mg  total) by mouth every 2 (two) hours as needed for migraine. Do not exceed 2 tabs per 24 hours  . [DISCONTINUED] atorvastatin (LIPITOR) 10 MG tablet Take 1 Tablet by mouth once daily (Patient not taking: Reported on 12/13/2017)   No facility-administered encounter medications on file as of 01/24/2018.      Review of Systems  Constitutional: Negative.        Not sleeping well   HENT: Negative.   Eyes: Negative.   Respiratory: Negative.   Cardiovascular: Negative.   Gastrointestinal: Negative.   Endocrine: Negative.   Genitourinary: Negative.   Musculoskeletal: Positive for arthralgias. Back pain: right shoulder pain = into arm- dull ache.  Skin: Negative.   Allergic/Immunologic: Negative.   Neurological: Negative.   Hematological: Negative.   Psychiatric/Behavioral: Negative.        Objective:   Physical Exam  Constitutional: She is oriented to person, place, and time. She appears well-developed and well-nourished. No distress.  The patient is pleasant and alert  HENT:  Head: Normocephalic and atraumatic.  Right Ear: External ear normal.  Left Ear: External ear normal.  Mouth/Throat: Oropharynx is clear and moist. No oropharyngeal exudate.  Slight nasal congestion bilaterally  Eyes: Conjunctivae and EOM are normal. Pupils are equal, round, and reactive to light. Right eye exhibits no discharge. Left eye exhibits no discharge. No scleral icterus.  Continue with regular eye exams  Neck: Normal range of motion. Neck supple. No thyromegaly present.  No bruits thyromegaly or anterior cervical adenopathy  Cardiovascular: Normal rate, regular rhythm, normal heart sounds and intact distal pulses.  No murmur heard. The heart is regular at 72/min without murmurs or gallops  Pulmonary/Chest: Effort normal and breath sounds normal. No respiratory distress. She has no wheezes. She has no rales.  Clear anteriorly and posteriorly  Abdominal: Soft. Bowel sounds are normal. She exhibits no  mass. There is no tenderness. There is no rebound and no guarding.  No abdominal tenderness masses organ enlargement bruits or inguinal adenopathy  Genitourinary:  Genitourinary Comments: This exam is done by gynecology  Musculoskeletal: Normal range of motion. She exhibits tenderness. She exhibits no edema.  Tender over humerus at right deltoid insertion.  60 of Depo-Medrol injected to this area after sterile cleansing and pressure dressing applied afterward.  Patient tolerated procedure well.  Lymphadenopathy:    She has no cervical adenopathy.  Neurological: She is alert and oriented to person, place, and time. She has normal reflexes. No cranial nerve deficit.  Skin: Skin is warm and dry. No rash noted.  Psychiatric: She has a normal mood and affect. Her behavior is normal. Judgment and thought content normal.  Nursing note and vitals reviewed.  BP 119/68 (BP Location: Left Arm)   Pulse 70   Temp 97.6 F (36.4 C) (Oral)   Ht _0  (1.6 m)   Wt 142 lb (64.4 kg)   BMI 25.15 kg/m         Assessment & Plan:  1. Annual physical exam -The patient is doing well overall and we will call her when the lab work is returned - Thyroid Panel With TSH - Hepatic function panel - VITAMIN D 25 Hydroxy (Vit-D Deficiency, Fractures) - Lipid panel - CBC with Differential/Platelet - BMP8+EGFR  2. Vitamin D deficiency -Continue current treatment pending results of lab work - VITAMIN D 25 Hydroxy (Vit-D Deficiency, Fractures) - CBC with Differential/Platelet - BMP8+EGFR - DG [facility] DEXA; Future  3. Pure hypercholesterolemia -Continue current treatment and aggressive therapeutic lifestyle changes pending results of lab work - Hepatic function panel - Lipid panel - CBC with Differential/Platelet - BMP8+EGFR  4. Postmenopausal -Continue regular follow-ups with gynecology - DG [facility] DEXA; Future  5. Screening for osteoporosis - DG [facility] DEXA; Future  6. Deltoid tendinitis of right  shoulder -60 of Depo-Medrol injected and patient tolerated procedure well to area of tenderness. - methylPREDNISolone acetate (DEPO-MEDROL) injection 60 mg  Patient Instructions  Continue current medications. Continue good therapeutic lifestyle changes which include good diet and exercise. Fall precautions discussed with patient. If an FOBT was given today- please return it to our front desk. If you are over 6 years old - you may need Prevnar 2 or the adult Pneumonia vaccine.  **Flu shots are available--- please call and schedule a FLU-CLINIC appointment**  After your visit with [place] today you will receive a survey in the mail or online from [employer] regarding your care with [place]. Please take a moment to fill this out. Your feedback is very important to [place] as you can help [place] better understand your patient needs as well as improve your experience and satisfaction. WE CARE ABOUT YOU!!!   Continue with exercise regimen Drink plenty of fluids especially water and stay well-hydrated For the next couple of weeks decrease activity to right arm as much as possible to allow the shot given today  to help the tendinitis Continue to watch sodium intake Continue as aggressive therapeutic lifestyle changes as possible to keep cholesterol under control   Arrie Senate MD

## 2018-01-24 NOTE — Patient Instructions (Addendum)
Continue current medications. Continue good therapeutic lifestyle changes which include good diet and exercise. Fall precautions discussed with patient. If an FOBT was given today- please return it to our front desk. If you are over 57 years old - you may need Prevnar 68 or the adult Pneumonia vaccine.  **Flu shots are available--- please call and schedule a FLU-CLINIC appointment**  After your visit with [place] today you will receive a survey in the mail or online from [employer] regarding your care with [place]. Please take a moment to fill this out. Your feedback is very important to [place] as you can help [place] better understand your patient needs as well as improve your experience and satisfaction. WE CARE ABOUT YOU!!!   Continue with exercise regimen Drink plenty of fluids especially water and stay well-hydrated For the next couple of weeks decrease activity to right arm as much as possible to allow the shot given today to help the tendinitis Continue to watch sodium intake Continue as aggressive therapeutic lifestyle changes as possible to keep cholesterol under control

## 2018-01-25 LAB — BMP8+EGFR
BUN / CREAT RATIO: 14 (ref 9–23)
BUN: 10 mg/dL (ref 6–24)
CO2: 23 mmol/L (ref 20–29)
CREATININE: 0.72 mg/dL (ref 0.57–1.00)
Calcium: 9.6 mg/dL (ref 8.7–10.2)
Chloride: 103 mmol/L (ref 96–106)
GFR, EST AFRICAN AMERICAN: 108 mL/min/{1.73_m2} (ref >59)
GFR, EST NON AFRICAN AMERICAN: 93 mL/min/{1.73_m2} (ref >59)
Glucose: 80 mg/dL (ref 65–99)
POTASSIUM: 3.9 mmol/L (ref 3.5–5.2)
SODIUM: 142 mmol/L (ref 134–144)

## 2018-01-25 LAB — HEPATIC FUNCTION PANEL
ALT: 18 IU/L (ref 0–32)
AST: 23 IU/L (ref 0–40)
Albumin: 4.6 g/dL (ref 3.5–5.5)
Alkaline Phosphatase: 57 IU/L (ref 39–117)
BILIRUBIN TOTAL: 1.1 mg/dL (ref 0.0–1.2)
BILIRUBIN, DIRECT: 0.21 mg/dL (ref 0.00–0.40)
TOTAL PROTEIN: 7.3 g/dL (ref 6.0–8.5)

## 2018-01-25 LAB — LIPID PANEL
CHOL/HDL RATIO: 4.1 ratio (ref 0.0–4.4)
Cholesterol, Total: 204 mg/dL — ABNORMAL HIGH (ref 100–199)
HDL: 50 mg/dL (ref >39)
LDL Calculated: 140 mg/dL — ABNORMAL HIGH (ref 0–99)
TRIGLYCERIDES: 68 mg/dL (ref 0–149)
VLDL CHOLESTEROL CAL: 14 mg/dL (ref 5–40)

## 2018-01-25 LAB — CBC WITH DIFFERENTIAL/PLATELET
Basophils Absolute: 0 10*3/uL (ref 0.0–0.2)
Basos: 0 %
EOS (ABSOLUTE): 0.3 10*3/uL (ref 0.0–0.4)
EOS: 5 %
HEMATOCRIT: 38.2 % (ref 34.0–46.6)
HEMOGLOBIN: 12.9 g/dL (ref 11.1–15.9)
Immature Grans (Abs): 0 10*3/uL (ref 0.0–0.1)
Immature Granulocytes: 0 %
LYMPHS ABS: 2.1 10*3/uL (ref 0.7–3.1)
Lymphs: 38 %
MCH: 31.7 pg (ref 26.6–33.0)
MCHC: 33.8 g/dL (ref 31.5–35.7)
MCV: 94 fL (ref 79–97)
MONOCYTES: 6 %
MONOS ABS: 0.3 10*3/uL (ref 0.1–0.9)
NEUTROS ABS: 2.8 10*3/uL (ref 1.4–7.0)
Neutrophils: 51 %
Platelets: 215 10*3/uL (ref 150–379)
RBC: 4.07 x10E6/uL (ref 3.77–5.28)
RDW: 13.6 % (ref 12.3–15.4)
WBC: 5.5 10*3/uL (ref 3.4–10.8)

## 2018-01-25 LAB — THYROID PANEL WITH TSH
FREE THYROXINE INDEX: 2 (ref 1.2–4.9)
T3 UPTAKE RATIO: 26 % (ref 24–39)
T4, Total: 7.5 ug/dL (ref 4.5–12.0)
TSH: 1.93 u[IU]/mL (ref 0.450–4.500)

## 2018-01-25 LAB — VITAMIN D 25 HYDROXY (VIT D DEFICIENCY, FRACTURES): Vit D, 25-Hydroxy: 36.8 ng/mL (ref 30.0–100.0)

## 2018-02-12 ENCOUNTER — Other Ambulatory Visit: Payer: Self-pay | Admitting: *Deleted

## 2018-02-12 ENCOUNTER — Encounter: Payer: Self-pay | Admitting: *Deleted

## 2018-02-12 MED ORDER — ROSUVASTATIN CALCIUM 5 MG PO TABS: ORAL_TABLET | ORAL | 3 refills | Status: DC

## 2018-03-25 NOTE — Progress Notes (Signed)
prolia insurance verification submitted

## 2018-06-28 ENCOUNTER — Telehealth: Payer: Self-pay | Admitting: *Deleted

## 2018-06-28 NOTE — Telephone Encounter (Signed)
Actonel 35, one weekly with arising the first thing with a large glass of water. Do not lay back down after taking. Give one month supply .

## 2018-06-28 NOTE — Telephone Encounter (Signed)
Following up on Prolia. Patient has a $2500 deductible to meet before they will pay for Prolia. A prior authorization is also required. A requirement for prior authorization approval is that she has to have tried and failed two bisphosphonates or that she has a documented contraindication. GERD is not sufficient according to [facility].   Would you recommend trying a bisphosphonate?

## 2018-07-01 NOTE — Telephone Encounter (Signed)
Unable to speak with patient but left a message to return my call.  Will discuss starting Actonel.

## 2018-10-01 ENCOUNTER — Ambulatory Visit (INDEPENDENT_AMBULATORY_CARE_PROVIDER_SITE_OTHER): Payer: Managed Care, Other (non HMO)

## 2018-10-01 DIAGNOSIS — Z23 Encounter for immunization: Secondary | ICD-10-CM | POA: Diagnosis not present

## 2018-10-14 ENCOUNTER — Telehealth: Payer: Self-pay | Admitting: Family Medicine

## 2018-10-18 ENCOUNTER — Encounter: Payer: Self-pay | Admitting: Family Medicine

## 2018-10-18 ENCOUNTER — Ambulatory Visit (INDEPENDENT_AMBULATORY_CARE_PROVIDER_SITE_OTHER): Payer: Managed Care, Other (non HMO) | Admitting: Family Medicine

## 2018-10-18 VITALS — BP 115/63 | HR 78 | Temp 97.5°F | Ht 63.0 in | Wt 142.0 lb

## 2018-10-18 DIAGNOSIS — J301 Allergic rhinitis due to pollen: Secondary | ICD-10-CM

## 2018-10-18 DIAGNOSIS — H6983 Other specified disorders of Eustachian tube, bilateral: Secondary | ICD-10-CM

## 2018-10-18 MED ORDER — PREDNISONE 10 MG PO TABS: ORAL_TABLET | ORAL | 0 refills | Status: DC

## 2018-10-18 MED ORDER — FLUTICASONE PROPIONATE 50 MCG/ACT NA SUSP: 2.0000 | Freq: Every day | NASAL | 6 refills | Status: DC

## 2018-10-18 NOTE — Patient Instructions (Addendum)
Use nasal saline frequently in each nostril at least 4 times daily Use of Flonase initially 2 sprays each nostril at bedtime and may reduce to 1 spray each nostril after 10 to 14 days. Drink plenty of fluids and stay well-hydrated Continue with diphenhydramine at bedtime Do not use an overhead fan and keep the house as cool as possible If not better after a couple weeks, call and we will make arrangements for you to see the ear nose and throat specialist, Dr. Wilburn Cornelia. Prednisone as directed

## 2018-10-18 NOTE — Progress Notes (Signed)
Subjective:    Patient ID: Carla Moreno, female    DOB: 13-Jul-1961, 57 y.o.   MRN: [id number]  HPI Patient here today for ringing of the ears.  The patient has been complaining of ringing in her ears or tinnitus for a couple months.  Her vital signs are stable.  Her weight is stable.  She is taking omega-3 fatty acids and Imitrex for hyperlipidemia and migraine headache.  She also has vitamin D deficiency.  The patient says that she is actually not having ringing but just a dull sound in her ears constantly and is been going on since August.  She is taking 24-hour allergy which sounds like Benadryl.  She has had some headaches on top of her head but no fever and no cough and the head drainage is clear.    Patient Active Problem List   Diagnosis Date Noted  . Hyperlipidemia 10/09/2016  . Osteopenia 10/11/2015  . Migraine 03/19/2015  . Vitamin D deficiency 04/29/2014  . Current use of estrogen therapy 04/29/2014  . Hx of migraines 04/03/2013  . Elevated LDL cholesterol level 04/03/2013  . Special screening for malignant neoplasms, colon 09/18/2011  . Diverticulosis of colon (without mention of hemorrhage) 09/18/2011  . Melanosis coli 09/18/2011   Outpatient Encounter Medications as of 10/18/2018  Medication Sig  . Omega 3 1000 MG CAPS Take by mouth.  . SUMAtriptan (IMITREX) 100 MG tablet Take 1 tablet (100 mg total) by mouth every 2 (two) hours as needed for migraine. Do not exceed 2 tabs per 24 hours  . UNABLE TO FIND Med Name:  Cholest-off  . [DISCONTINUED] rosuvastatin (CRESTOR) 5 MG tablet Take 5 mg on Mondays, Wednesday and Friday   No facility-administered encounter medications on file as of 10/18/2018.       Review of Systems  Constitutional: Negative.   HENT: Positive for tinnitus.   Eyes: Negative.   Respiratory: Negative.   Cardiovascular: Negative.   Gastrointestinal: Negative.   Endocrine: Negative.   Genitourinary: Negative.   Musculoskeletal: Negative.     Skin: Negative.   Allergic/Immunologic: Negative.   Neurological: Negative.   Hematological: Negative.   Psychiatric/Behavioral: Negative.        Objective:   Physical Exam  Constitutional: She is oriented to person, place, and time. She appears well-developed and well-nourished. No distress.  The patient is pleasant and alert and in no acute distress  HENT:  Head: Normocephalic and atraumatic.  Mouth/Throat: Oropharynx is clear and moist. No oropharyngeal exudate.  Mild nasal turbinate congestion left greater than right.  Both TMs are clear and may be some mild bulging looking at the light reflex.  This is bilaterally.  Eyes: Pupils are equal, round, and reactive to light. Conjunctivae and EOM are normal. Right eye exhibits no discharge. Left eye exhibits no discharge. No scleral icterus.  Neck: Normal range of motion. Neck supple. No thyromegaly present.  Cardiovascular: Normal rate, regular rhythm and normal heart sounds.  No murmur heard. Pulmonary/Chest: Effort normal and breath sounds normal. She has no wheezes. She has no rales.  Musculoskeletal: Normal range of motion. She exhibits no edema.  Lymphadenopathy:    She has no cervical adenopathy.  Neurological: She is alert and oriented to person, place, and time. She has normal reflexes.  Skin: Skin is warm and dry. No rash noted.  Psychiatric: She has a normal mood and affect. Her behavior is normal. Judgment and thought content normal.  Patient's mood affect and behavior are all normal  Nursing note and vitals reviewed.  BP 115/63 (BP Location: Left Arm)   Pulse 78   Temp (!) 97.5 F (36.4 C) (Oral)   Ht 5\' 3"  (1.6 m)   Wt 142 lb (64.4 kg)   BMI 25.15 kg/m         Assessment & Plan:  1. Dysfunction of both eustachian tubes -Prednisone, 10 taper for 8 days -Use Flonase 2 sprays each nostril at bedtime -Use nasal saline frequently  2. Seasonal allergic rhinitis due to pollen -Continue with 24-hour allergy.   May want to consider trying Allegra-D -Drink plenty of fluids and stay well-hydrated  No orders of the defined types were placed in this encounter.  Patient Instructions  Use nasal saline frequently in each nostril at least 4 times daily Use of Flonase initially 2 sprays each nostril at bedtime and may reduce to 1 spray each nostril after 10 to 14 days. Drink plenty of fluids and stay well-hydrated Continue with diphenhydramine at bedtime Do not use an overhead fan and keep the house as cool as possible If not better after a couple weeks, call and we will make arrangements for you to see the ear nose and throat specialist, Dr. Wilburn Cornelia. Prednisone as directed  Arrie Senate MD

## 2018-11-19 ENCOUNTER — Other Ambulatory Visit: Payer: Self-pay | Admitting: Obstetrics and Gynecology

## 2018-11-19 DIAGNOSIS — Z1231 Encounter for screening mammogram for malignant neoplasm of breast: Secondary | ICD-10-CM

## 2018-12-04 DIAGNOSIS — H903 Sensorineural hearing loss, bilateral: Secondary | ICD-10-CM | POA: Insufficient documentation

## 2018-12-04 DIAGNOSIS — H9313 Tinnitus, bilateral: Secondary | ICD-10-CM | POA: Insufficient documentation

## 2018-12-16 ENCOUNTER — Ambulatory Visit: Payer: BLUE CROSS/BLUE SHIELD | Admitting: Nurse Practitioner

## 2019-01-06 ENCOUNTER — Ambulatory Visit: Payer: BLUE CROSS/BLUE SHIELD

## 2019-01-22 NOTE — Progress Notes (Deleted)
[facility]  PATIENT: Carla Moreno DOB: 03/21/1961   REASON FOR VISIT: Follow-up for migraines HISTORY FROM: Patient    HISTORY OF PRESENT ILLNESS:Carla Moreno is a 58 year old right-handed white female with a history of migraine headaches. She is having about 1 migraine every few months relieved with Imitrex acutely. The patient indicates that weather changes may bring on the headache. Carla Moreno is aware of migraine triggers and tries to avoid those. She continues  To be very active and  Exercises.The patient returns to this office for an evaluation.   REVIEW OF SYSTEMS: Full 14 system review of systems performed and notable only for those listed, all others are neg:  Constitutional: neg  Cardiovascular: neg Ear/Nose/Throat: neg  Skin: neg Eyes: neg Respiratory: neg Gastroitestinal: neg  Hematology/Lymphatic: neg  Endocrine: neg Musculoskeletal: Allergy/Immunology: neg Neurological: History of migraines Psychiatric: neg Sleep : neg   ALLERGIES: No Known Allergies  HOME MEDICATIONS: Outpatient Medications Prior to Visit  Medication Sig Dispense Refill  . fluticasone (FLONASE) 50 MCG/ACT nasal spray Place 2 sprays into both nostrils daily. 16 g 6  . Omega 3 1000 MG CAPS Take by mouth.    . predniSONE (DELTASONE) 10 MG tablet Take 1 tab QID x 2 days, then 1 tab TID x 2 days, then 1 tab BID x 2 days, then 1 tab QD x 2 days. 20 tablet 0  . SUMAtriptan (IMITREX) 100 MG tablet Take 1 tablet (100 mg total) by mouth every 2 (two) hours as needed for migraine. Do not exceed 2 tabs per 24 hours 27 tablet 1  . UNABLE TO FIND Med Name:  Cholest-off     No facility-administered medications prior to visit.     PAST MEDICAL HISTORY: Past Medical History:  Diagnosis Date  . Hyperlipidemia   . Migraines     PAST SURGICAL HISTORY: Past Surgical History:  Procedure Laterality Date  . ABDOMINAL HYSTERECTOMY  2001  . CESAREAN SECTION     2 occasions     FAMILY HISTORY: Family History  Problem Relation Age of Onset  . Pneumonia Mother   . Hyperlipidemia Paternal Grandmother        diet controlled  . Migraines Neg Hx     SOCIAL HISTORY: Social History   Socioeconomic History  . Marital status: Married    Spouse name: Not on file  . Number of children: Not on file  . Years of education: Not on file  . Highest education level: Not on file  Occupational History  . Not on file  Social Needs  . Financial resource strain: Not on file  . Food insecurity:    Worry: Not on file    Inability: Not on file  . Transportation needs:    Medical: Not on file    Non-medical: Not on file  Tobacco Use  . Smoking status: Former Smoker    Packs/day: 0.50    Types: Cigarettes    Start date: 12/18/1976    Last attempt to quit: 12/18/1986    Years since quitting: 32.1  . Smokeless tobacco: Never Used  Substance and Sexual Activity  . Alcohol use: Yes    Alcohol/week: 2.0 standard drinks    Types: 2 Glasses of wine per week  . Drug use: No  . Sexual activity: Not on file  Lifestyle  . Physical activity:    Days per week: Not on file    Minutes per session: Not on file  . Stress: Not on file  Relationships  . Social connections:    Talks on phone: Not on file    Gets together: Not on file    Attends religious service: Not on file    Active member of club or organization: Not on file    Attends meetings of clubs or organizations: Not on file    Relationship status: Not on file  . Intimate partner violence:    Fear of current or ex partner: Not on file    Emotionally abused: Not on file    Physically abused: Not on file    Forced sexual activity: Not on file  Other Topics Concern  . Not on file  Social History Narrative  . Not on file     PHYSICAL EXAM  There were no vitals filed for this visit. There is no height or weight on file to calculate BMI.  Generalized: Well developed, in no acute distress  Neurological  examination   Mentation: Alert oriented to time, place, history taking. Attention span and concentration appropriate. Recent and remote memory intact.  Follows all commands speech and language fluent.   Cranial nerve II-XII: Pupils were equal round reactive to light extraocular movements were full, visual field were full on confrontational test. Facial sensation and strength were normal. hearing was intact to finger rubbing bilaterally. Uvula tongue midline. head turning and shoulder shrug were normal and symmetric.Tongue protrusion into cheek strength was normal. Motor: normal bulk and tone, full strength in the BUE, BLE, Coordination: finger-nose-finger, heel-to-shin bilaterally, no dysmetria Reflexes: Brachioradialis 2/2, biceps 2/2, triceps 2/2, patellar 2/2, Achilles 2/2, plantar responses were flexor bilaterally. Gait and Station: Rising up from seated position without assistance, normal stance,  moderate stride, good arm swing, smooth turning, able to perform tiptoe, and heel walking without difficulty. Tandem gait is steady  DIAGNOSTIC DATA (LABS, IMAGING, TESTING)   ASSESSMENT AND PLAN  58 y.o. year old female  has a past medical history of Migraines here to follow-up. She remains on Imitrex acutely.  She has a migraine about every 2-3 months.  Continue Imitrex acutely for migraine refill  Call for increase in headaches Follow-up yearly and when necessary Eliminate migraine triggers Carla Moreno, [facility], [facility], [facility] [address], [place], Carla Moreno [address] [phone]

## 2019-01-23 ENCOUNTER — Ambulatory Visit: Payer: Self-pay | Admitting: Nurse Practitioner

## 2019-01-27 ENCOUNTER — Ambulatory Visit (INDEPENDENT_AMBULATORY_CARE_PROVIDER_SITE_OTHER): Payer: Managed Care, Other (non HMO) | Admitting: Family Medicine

## 2019-01-27 ENCOUNTER — Encounter: Payer: Self-pay | Admitting: Family Medicine

## 2019-01-27 VITALS — BP 118/66 | HR 62 | Temp 97.0°F | Ht 63.0 in | Wt 143.0 lb

## 2019-01-27 DIAGNOSIS — R42 Dizziness and giddiness: Secondary | ICD-10-CM | POA: Diagnosis not present

## 2019-01-27 DIAGNOSIS — Z0000 Encounter for general adult medical examination without abnormal findings: Secondary | ICD-10-CM

## 2019-01-27 DIAGNOSIS — E7800 Pure hypercholesterolemia, unspecified: Secondary | ICD-10-CM

## 2019-01-27 DIAGNOSIS — Z0001 Encounter for general adult medical examination with abnormal findings: Secondary | ICD-10-CM

## 2019-01-27 DIAGNOSIS — E559 Vitamin D deficiency, unspecified: Secondary | ICD-10-CM | POA: Diagnosis not present

## 2019-01-27 NOTE — Progress Notes (Signed)
Subjective:    Patient ID: Carla Moreno, female    DOB: [birth year], 58 y.o.   MRN: [id number]  HPI Patient is here today for annual physical exam without a pelvic exam.  She is taking her medication regularly.  She gets her pelvic and mammogram exams with Dr. Ulanda Edison her gynecologist.  She is due to get a chest x-ray but we will wait since our radiology technicians are out.  Will get lab work today but she will be given an FOBT to return.  Her last colonoscopy was in October 2012.  She is currently taking omega-3 fatty acid Flonase and Imitrex if needed for migraine headaches.  The patient is alert pleasant and doing well.  Several weeks ago she did have an episode of dizziness while she was driving with no recent history of nasal congestion.  She did take some Sudafed and this seemed to get better.  She does have a visit with a neurologist for migraines next week and will bring this up to them to see if they have any other thoughts about the dizziness.  Told her that she continue to have trouble that she should get back and see [place].  She denies any chest pain pressure tightness or shortness of breath anymore than usual she does occasionally have some palpitations.  She usually associates that with drinking caffeine.  She denies any trouble with swallowing heartburn indigestion nausea vomiting diarrhea blood in the stool or black tarry bowel movements.  She is aware that she is due to get another colonoscopy in the fall 2012.  She is passing her water without problems.  She has had her mammogram done.  She goes to see the neurologist yearly but her headache frequency has diminished significantly.  Patient Active Problem List   Diagnosis Date Noted  . Hyperlipidemia 10/09/2016  . Osteopenia 10/11/2015  . Migraine 03/19/2015  . Vitamin D deficiency 04/29/2014  . Current use of estrogen therapy 04/29/2014  . Hx of migraines 04/03/2013  . Elevated LDL cholesterol level 04/03/2013  . Special  screening for malignant neoplasms, colon 09/18/2011  . Diverticulosis of colon (without mention of hemorrhage) 09/18/2011  . Melanosis coli 09/18/2011   Outpatient Encounter Medications as of 01/27/2019  Medication Sig  . fluticasone (FLONASE) 50 MCG/ACT nasal spray Place 2 sprays into both nostrils daily.  . Omega 3 1000 MG CAPS Take by mouth.  . SUMAtriptan (IMITREX) 100 MG tablet Take 1 tablet (100 mg total) by mouth every 2 (two) hours as needed for migraine. Do not exceed 2 tabs per 24 hours  . UNABLE TO FIND Med Name:  Cholest-off  . [DISCONTINUED] predniSONE (DELTASONE) 10 MG tablet Take 1 tab QID x 2 days, then 1 tab TID x 2 days, then 1 tab BID x 2 days, then 1 tab QD x 2 days.   No facility-administered encounter medications on file as of 01/27/2019.       Review of Systems  Constitutional: Negative.   Eyes: Negative.   Respiratory: Negative.   Cardiovascular: Negative.   Gastrointestinal: Negative.   Endocrine: Negative.   Genitourinary: Negative.   Musculoskeletal: Negative.   Skin: Negative.   Allergic/Immunologic: Negative.   Neurological: Positive for dizziness (sinus meds helped ).  Hematological: Negative.   Psychiatric/Behavioral: Negative.        Objective:   Physical Exam Vitals signs and nursing note reviewed.  Constitutional:      General: She is not in acute distress.    Appearance: Normal  appearance. She is well-developed and normal weight. She is not ill-appearing.     Comments: Patient is pleasant and alert and doing well overall.  HENT:     Head: Normocephalic and atraumatic.     Right Ear: Tympanic membrane, ear canal and external ear normal. There is no impacted cerumen.     Left Ear: Tympanic membrane, ear canal and external ear normal. There is no impacted cerumen.     Nose: Congestion present. No rhinorrhea.     Comments: Minimal nasal congestion bilaterally left greater than right    Mouth/Throat:     Mouth: Mucous membranes are moist.      Pharynx: Oropharynx is clear. No oropharyngeal exudate or posterior oropharyngeal erythema.  Eyes:     General: No scleral icterus.       Right eye: No discharge.        Left eye: No discharge.     Extraocular Movements: Extraocular movements intact.     Conjunctiva/sclera: Conjunctivae normal.     Pupils: Pupils are equal, round, and reactive to light.  Neck:     Musculoskeletal: Normal range of motion and neck supple.     Thyroid: No thyromegaly.     Vascular: No carotid bruit or JVD.     Comments: No thyromegaly anterior cervical adenopathy or bruits Cardiovascular:     Rate and Rhythm: Normal rate and regular rhythm.     Pulses: Normal pulses.     Heart sounds: Normal heart sounds. No murmur.     Comments: Heart is regular at 72/min Pulmonary:     Effort: Pulmonary effort is normal. No respiratory distress.     Breath sounds: Normal breath sounds. No wheezing or rales.     Comments: Lungs are clear anteriorly and posteriorly Abdominal:     General: Abdomen is flat. Bowel sounds are normal.     Palpations: Abdomen is soft. There is no mass.     Tenderness: There is no abdominal tenderness. There is no guarding.     Comments: Liver or spleen enlargement organ enlargement masses or bruits and no inguinal adenopathy  Genitourinary:    Comments: Sees Dr. Ulanda Edison group for this exam Musculoskeletal: Normal range of motion.        General: No tenderness.     Right lower leg: No edema.     Left lower leg: No edema.  Lymphadenopathy:     Cervical: No cervical adenopathy.  Skin:    General: Skin is warm and dry.     Findings: No rash.  Neurological:     General: No focal deficit present.     Mental Status: She is alert and oriented to person, place, and time. Mental status is at baseline.     Cranial Nerves: No cranial nerve deficit.     Sensory: No sensory deficit.     Motor: No weakness.     Deep Tendon Reflexes: Reflexes are normal and symmetric. Reflexes normal.    Psychiatric:        Mood and Affect: Mood normal.        Behavior: Behavior normal.        Thought Content: Thought content normal.        Judgment: Judgment normal.     Comments: Mood affect and behavior are all normal for this patient     BP 118/66 (BP Location: Left Arm)   Pulse 62   Temp (!) 97 F (36.1 C) (Oral)   Ht _0  (1.6 m)  Wt 143 lb (64.9 kg)   BMI 25.33 kg/m        Assessment & Plan:  1. Annual physical exam -Patient appears to be doing well and the single episode of dizziness has resolved and she will discuss this when she goes to visit with a neurologist. - BMP8+EGFR - CBC with Differential/Platelet - Lipid panel - Thyroid Panel With TSH - Hepatic function panel  2. Pure hypercholesterolemia -Continue aggressive therapeutic lifestyle changes and if triglycerides are elevated consider adding Vascepa and stopping over-the-counter omega-3's. - CBC with Differential/Platelet - Lipid panel - Hepatic function panel  3. Vitamin D deficiency -Continue with vitamin D replacement - CBC with Differential/Platelet  4.  Episode of dizziness -This has resolved and no finding today to explain this. -Patient will get back in touch with [place] if this occurs again -She should continue to watch her caffeine intake.   No orders of the defined types were placed in this encounter.  Patient Instructions  Continue current medications. Continue good therapeutic lifestyle changes which include good diet and exercise. Fall precautions discussed with patient. If an FOBT was given today- please return it to our front desk. If you are over 59 years old - you may need Prevnar 5 or the adult Pneumonia vaccine.  **Flu shots are available--- please call and schedule a FLU-CLINIC appointment**  After your visit with [place] today you will receive a survey in the mail or online from [employer] regarding your care with [place]. Please take a moment to fill this out. Your feedback is very  important to [place] as you can help [place] better understand your patient needs as well as improve your experience and satisfaction. WE CARE ABOUT YOU!!!   For the rest of the winter continue to practice good hand hygiene and pulmonary hygiene Drink plenty of water and fluids and stay well-hydrated Use the coolmist humidifier as you are already doing and keep the house as cool as possible If you continue to have episodes of dizziness get back in touch with [place] and certainly if you are driving pull over to the side of the road and call for help Discuss these dizzy episodes with a neurologist that you visit next week   Arrie Senate MD

## 2019-01-27 NOTE — Patient Instructions (Addendum)
Continue current medications. Continue good therapeutic lifestyle changes which include good diet and exercise. Fall precautions discussed with patient. If an FOBT was given today- please return it to our front desk. If you are over 58 years old - you may need Prevnar 65 or the adult Pneumonia vaccine.  **Flu shots are available--- please call and schedule a FLU-CLINIC appointment**  After your visit with [place] today you will receive a survey in the mail or online from [employer] regarding your care with [place]. Please take a moment to fill this out. Your feedback is very important to [place] as you can help [place] better understand your patient needs as well as improve your experience and satisfaction. WE CARE ABOUT YOU!!!   For the rest of the winter continue to practice good hand hygiene and pulmonary hygiene Drink plenty of water and fluids and stay well-hydrated Use the coolmist humidifier as you are already doing and keep the house as cool as possible If you continue to have episodes of dizziness get back in touch with [place] and certainly if you are driving pull over to the side of the road and call for help Discuss these dizzy episodes with a neurologist that you visit next week

## 2019-01-28 LAB — LIPID PANEL
CHOLESTEROL TOTAL: 202 mg/dL — AB (ref 100–199)
Chol/HDL Ratio: 3.6 ratio (ref 0.0–4.4)
HDL: 56 mg/dL (ref >39)
LDL Calculated: 132 mg/dL — ABNORMAL HIGH (ref 0–99)
Triglycerides: 72 mg/dL (ref 0–149)
VLDL CHOLESTEROL CAL: 14 mg/dL (ref 5–40)

## 2019-01-28 LAB — CBC WITH DIFFERENTIAL/PLATELET
BASOS: 1 %
Basophils Absolute: 0 10*3/uL (ref 0.0–0.2)
EOS (ABSOLUTE): 0.2 10*3/uL (ref 0.0–0.4)
Eos: 3 %
Hematocrit: 37.4 % (ref 34.0–46.6)
Hemoglobin: 12.7 g/dL (ref 11.1–15.9)
IMMATURE GRANS (ABS): 0 10*3/uL (ref 0.0–0.1)
IMMATURE GRANULOCYTES: 0 %
LYMPHS: 40 %
Lymphocytes Absolute: 2.3 10*3/uL (ref 0.7–3.1)
MCH: 30.9 pg (ref 26.6–33.0)
MCHC: 34 g/dL (ref 31.5–35.7)
MCV: 91 fL (ref 79–97)
MONOCYTES: 7 %
Monocytes Absolute: 0.4 10*3/uL (ref 0.1–0.9)
NEUTROS PCT: 49 %
Neutrophils Absolute: 2.9 10*3/uL (ref 1.4–7.0)
PLATELETS: 241 10*3/uL (ref 150–450)
RBC: 4.11 x10E6/uL (ref 3.77–5.28)
RDW: 12.8 % (ref 11.7–15.4)
WBC: 5.8 10*3/uL (ref 3.4–10.8)

## 2019-01-28 LAB — BMP8+EGFR
BUN/Creatinine Ratio: 19 (ref 9–23)
BUN: 12 mg/dL (ref 6–24)
CALCIUM: 9.4 mg/dL (ref 8.7–10.2)
CO2: 23 mmol/L (ref 20–29)
CREATININE: 0.62 mg/dL (ref 0.57–1.00)
Chloride: 102 mmol/L (ref 96–106)
GFR calc Af Amer: 115 mL/min/{1.73_m2} (ref >59)
GFR, EST NON AFRICAN AMERICAN: 100 mL/min/{1.73_m2} (ref >59)
Glucose: 87 mg/dL (ref 65–99)
POTASSIUM: 4 mmol/L (ref 3.5–5.2)
Sodium: 141 mmol/L (ref 134–144)

## 2019-01-28 LAB — HEPATIC FUNCTION PANEL
ALK PHOS: 56 IU/L (ref 39–117)
ALT: 22 IU/L (ref 0–32)
AST: 20 IU/L (ref 0–40)
Albumin: 4.6 g/dL (ref 3.8–4.9)
Bilirubin Total: 0.9 mg/dL (ref 0.0–1.2)
Bilirubin, Direct: 0.15 mg/dL (ref 0.00–0.40)
TOTAL PROTEIN: 7.2 g/dL (ref 6.0–8.5)

## 2019-01-28 LAB — THYROID PANEL WITH TSH
Free Thyroxine Index: 1.8 (ref 1.2–4.9)
T3 Uptake Ratio: 24 % (ref 24–39)
T4, Total: 7.4 ug/dL (ref 4.5–12.0)
TSH: 2.49 u[IU]/mL (ref 0.450–4.500)

## 2019-02-05 NOTE — Progress Notes (Signed)
[facility]  PATIENT: LAKEESHA FONTANILLA DOB: March 28, 1961   REASON FOR VISIT: Follow-up for migraines HISTORY FROM: Patient    HISTORY OF PRESENT ILLNESS:Ms. Jimmerson is a 58 year old right-handed white female with a history of migraine headaches. She is having about 1 migraine every few months relieved with Imitrex acutely. The patient indicates that weather changes may bring on the headache. Ms. Suto is aware of migraine triggers and tries to avoid those. She continues  To be very active and  Exercises.she had some episodes of dizziness several times a month ago.  That is resolved now but she decided it was dehydration because she had not had much fluid intake.  Now she has increased her intake to 64 ounces a day with no further dizziness noted.  The patient returns to this office for an evaluation.   REVIEW OF SYSTEMS: Full 14 system review of systems performed and notable only for those listed, all others are neg:  Constitutional: neg  Cardiovascular: neg Ear/Nose/Throat: neg  Skin: neg Eyes: neg Respiratory: neg Gastroitestinal: neg  Hematology/Lymphatic: neg  Endocrine: neg Musculoskeletal: Allergy/Immunology: neg Neurological: History of migraines Psychiatric: neg Sleep : neg   ALLERGIES: No Known Allergies  HOME MEDICATIONS: Outpatient Medications Prior to Visit  Medication Sig Dispense Refill  . fluticasone (FLONASE) 50 MCG/ACT nasal spray Place 2 sprays into both nostrils daily. 16 g 6  . Omega 3 1000 MG CAPS Take by mouth.    . SUMAtriptan (IMITREX) 100 MG tablet Take 1 tablet (100 mg total) by mouth every 2 (two) hours as needed for migraine. Do not exceed 2 tabs per 24 hours 27 tablet 1  . UNABLE TO FIND Med Name:  Cholest-off     No facility-administered medications prior to visit.     PAST MEDICAL HISTORY: Past Medical History:  Diagnosis Date  . Hyperlipidemia   . Migraines     PAST SURGICAL HISTORY: Past Surgical History:    Procedure Laterality Date  . ABDOMINAL HYSTERECTOMY  2001  . CESAREAN SECTION     2 occasions    FAMILY HISTORY: Family History  Problem Relation Age of Onset  . Pneumonia Mother   . Hyperlipidemia Paternal Grandmother        diet controlled  . Migraines Neg Hx     SOCIAL HISTORY: Social History   Socioeconomic History  . Marital status: Married    Spouse name: Not on file  . Number of children: Not on file  . Years of education: Not on file  . Highest education level: Not on file  Occupational History  . Not on file  Social Needs  . Financial resource strain: Not on file  . Food insecurity:    Worry: Not on file    Inability: Not on file  . Transportation needs:    Medical: Not on file    Non-medical: Not on file  Tobacco Use  . Smoking status: Former Smoker    Packs/day: 0.50    Types: Cigarettes    Start date: 12/18/1976    Last attempt to quit: 12/18/1986    Years since quitting: 32.1  . Smokeless tobacco: Never Used  Substance and Sexual Activity  . Alcohol use: Yes    Alcohol/week: 2.0 standard drinks    Types: 2 Glasses of wine per week  . Drug use: No  . Sexual activity: Not on file  Lifestyle  . Physical activity:    Days per week: Not on file    Minutes  per session: Not on file  . Stress: Not on file  Relationships  . Social connections:    Talks on phone: Not on file    Gets together: Not on file    Attends religious service: Not on file    Active member of club or organization: Not on file    Attends meetings of clubs or organizations: Not on file    Relationship status: Not on file  . Intimate partner violence:    Fear of current or ex partner: Not on file    Emotionally abused: Not on file    Physically abused: Not on file    Forced sexual activity: Not on file  Other Topics Concern  . Not on file  Social History Narrative  . Not on file     PHYSICAL EXAM  Vitals:   02/06/19 0755  BP: 123/72  Pulse: 67  Weight: 143 lb 6.4 oz  (65 kg)  Height: 5\' 3"  (1.6 m)   Body mass index is 25.4 kg/m.  Generalized: Well developed, in no acute distress  Neurological examination   Mentation: Alert oriented to time, place, history taking. Attention span and concentration appropriate. Recent and remote memory intact.  Follows all commands speech and language fluent.   Cranial nerve II-XII: Pupils were equal round reactive to light extraocular movements were full, visual field were full on confrontational test. Facial sensation and strength were normal. hearing was intact to finger rubbing bilaterally. Uvula tongue midline. head turning and shoulder shrug were normal and symmetric.Tongue protrusion into cheek strength was normal. Motor: normal bulk and tone, full strength in the BUE, BLE, Coordination: finger-nose-finger, heel-to-shin bilaterally, no dysmetria Reflexes: Brachioradialis 2/2, biceps 2/2, triceps 2/2, patellar 2/2, Achilles 2/2, plantar responses were flexor bilaterally. Gait and Station: Rising up from seated position without assistance, normal stance,  moderate stride, good arm swing, smooth turning, able to perform tiptoe, and heel walking without difficulty. Tandem gait is steady  DIAGNOSTIC DATA (LABS, IMAGING, TESTING)   ASSESSMENT AND PLAN  58 y.o. year old female  has a past medical history of Migraines here to follow-up. She remains on Imitrex acutely.  She has a migraine about every 3 -4 months.  Continue Imitrex acutely for migraine will refill  Call for increase in headaches Follow-up yearly and when necessary I spent 20 minutes in total face to face time with the patient more than 50% of which was spent counseling and coordination of care, reviewing test results reviewing medications and discussing and reviewing the diagnosis of migraine and also dizziness is related to dehydration.  Patient was given specific education materials and questions were answered Dennie Bible, [facility], [facility],  APRN  [facility] [address] [place], [place] [address] [phone]

## 2019-02-06 ENCOUNTER — Encounter: Payer: Self-pay | Admitting: Nurse Practitioner

## 2019-02-06 ENCOUNTER — Ambulatory Visit (INDEPENDENT_AMBULATORY_CARE_PROVIDER_SITE_OTHER): Payer: Managed Care, Other (non HMO) | Admitting: Nurse Practitioner

## 2019-02-06 VITALS — BP 123/72 | HR 67 | Ht 63.0 in | Wt 143.4 lb

## 2019-02-06 DIAGNOSIS — G43909 Migraine, unspecified, not intractable, without status migrainosus: Secondary | ICD-10-CM | POA: Diagnosis not present

## 2019-02-06 MED ORDER — SUMATRIPTAN SUCCINATE 100 MG PO TABS: 100.0000 mg | ORAL_TABLET | ORAL | 1 refills | Status: DC | PRN

## 2019-02-06 NOTE — Progress Notes (Signed)
I have read the note, and I agree with the clinical assessment and plan.  Carla Moreno K Bradlee Bridgers   

## 2019-02-06 NOTE — Patient Instructions (Addendum)
Continue Imitrex acutely for migraine will refill  Call for increase in headaches Follow-up yearly and when necessary Dehydration, Adult  Dehydration is when there is not enough fluid or water in your body. This happens when you lose more fluids than you take in. Dehydration can range from mild to very bad. It should be treated right away to keep it from getting very bad. Symptoms of mild dehydration may include:  Thirst.  Dry lips.  Slightly dry mouth.  Dry, warm skin.  Dizziness. Symptoms of moderate dehydration may include:  Very dry mouth.  Muscle cramps.  Dark pee (urine). Pee may be the color of tea.  Your body making less pee.  Your eyes making fewer tears.  Heartbeat that is uneven or faster than normal (palpitations).  Headache.  Light-headedness, especially when you stand up from sitting.  Fainting (syncope). Symptoms of very bad dehydration may include:  Changes in skin, such as: ? Cold and clammy skin. ? Blotchy (mottled) or pale skin. ? Skin that does not quickly return to normal after being lightly pinched and let go (poor skin turgor).  Changes in body fluids, such as: ? Feeling very thirsty. ? Your eyes making fewer tears. ? Not sweating when body temperature is high, such as in hot weather. ? Your body making very little pee.  Changes in vital signs, such as: ? Weak pulse. ? Pulse that is more than 100 beats a minute when you are sitting still. ? Fast breathing. ? Low blood pressure.  Other changes, such as: ? Sunken eyes. ? Cold hands and feet. ? Confusion. ? Lack of energy (lethargy). ? Trouble waking up from sleep. ? Short-term weight loss. ? Unconsciousness. Follow these instructions at home:   If told by your doctor, drink an ORS: ? Make an ORS by using instructions on the package. ? Start by drinking small amounts, about  cup (120 mL) every 5-10 minutes. ? Slowly drink more until you have had the amount that your doctor said  to have.  Drink enough clear fluid to keep your pee clear or pale yellow. If you were told to drink an ORS, finish the ORS first, then start slowly drinking clear fluids. Drink fluids such as: ? Water. Do not drink only water by itself. Doing that can make the salt (sodium) level in your body get too low (hyponatremia). ? Ice chips. ? Fruit juice that you have added water to (diluted). ? Low-calorie sports drinks.  Avoid: ? Alcohol. ? Drinks that have a lot of sugar. These include high-calorie sports drinks, fruit juice that does not have water added, and soda. ? Caffeine. ? Foods that are greasy or have a lot of fat or sugar.  Take over-the-counter and prescription medicines only as told by your doctor.  Do not take salt tablets. Doing that can make the salt level in your body get too high (hypernatremia).  Eat foods that have minerals (electrolytes). Examples include bananas, oranges, potatoes, tomatoes, and spinach.  Keep all follow-up visits as told by your doctor. This is important. Contact a doctor if:  You have belly (abdominal) pain that: ? Gets worse. ? Stays in one area (localizes).  You have a rash.  You have a stiff neck.  You get angry or annoyed more easily than normal (irritability).  You are more sleepy than normal.  You have a harder time waking up than normal.  You feel: ? Weak. ? Dizzy. ? Very thirsty.  You have peed (urinated) only  a small amount of very dark pee during 6-8 hours. Get help right away if:  You have symptoms of very bad dehydration.  You cannot drink fluids without throwing up (vomiting).  Your symptoms get worse with treatment.  You have a fever.  You have a very bad headache.  You are throwing up or having watery poop (diarrhea) and it: ? Gets worse. ? Does not go away.  You have blood or something green (bile) in your throw-up.  You have blood in your poop (stool). This may cause poop to look black and tarry.  You  have not peed in 6-8 hours.  You pass out (faint).  Your heart rate when you are sitting still is more than 100 beats a minute.  You have trouble breathing. This information is not intended to replace advice given to you by your health care provider. Make sure you discuss any questions you have with your health care provider. Document Released: 09/30/2009 Document Revised: 06/23/2016 Document Reviewed: 01/28/2016 Elsevier Interactive Patient Education  2019 [employer].

## 2019-03-18 ENCOUNTER — Ambulatory Visit (INDEPENDENT_AMBULATORY_CARE_PROVIDER_SITE_OTHER): Payer: Managed Care, Other (non HMO) | Admitting: Nurse Practitioner

## 2019-03-18 ENCOUNTER — Ambulatory Visit (INDEPENDENT_AMBULATORY_CARE_PROVIDER_SITE_OTHER): Payer: Managed Care, Other (non HMO)

## 2019-03-18 ENCOUNTER — Encounter: Payer: Self-pay | Admitting: Nurse Practitioner

## 2019-03-18 ENCOUNTER — Other Ambulatory Visit: Payer: Self-pay

## 2019-03-18 VITALS — BP 115/68 | HR 79 | Temp 97.9°F | Ht 63.0 in | Wt 142.0 lb

## 2019-03-18 DIAGNOSIS — R1032 Left lower quadrant pain: Secondary | ICD-10-CM

## 2019-03-18 DIAGNOSIS — K5732 Diverticulitis of large intestine without perforation or abscess without bleeding: Secondary | ICD-10-CM | POA: Diagnosis not present

## 2019-03-18 MED ORDER — CIPROFLOXACIN HCL 500 MG PO TABS: 500.0000 mg | ORAL_TABLET | Freq: Two times a day (BID) | ORAL | 0 refills | Status: DC

## 2019-03-18 MED ORDER — METRONIDAZOLE 500 MG PO TABS: 500.0000 mg | ORAL_TABLET | Freq: Two times a day (BID) | ORAL | 0 refills | Status: DC

## 2019-03-18 NOTE — Patient Instructions (Signed)

## 2019-03-18 NOTE — Progress Notes (Signed)
   Subjective:    Patient ID: Carla Moreno, female    DOB: 06-13-1961, 58 y.o.   MRN: [id number]   Chief Complaint: left lower abdominal pain   HPI Patient comes in today c/o left lower abdominal pain. This started about 3-4 days ago. Yesterday it has gotten worse. Rates pain 8/10, but patient says she does have a low pain tolerenace. She denies any urinary symptoms, nausea , vomiting , constipation or diarrhea. She says she had a bowel movement this morning and it did not change her pain.   * she had colonoscopy 8 years ago and was told she had slight IBS>   Review of Systems  Constitutional: Negative.   Respiratory: Negative.   Cardiovascular: Negative.   Gastrointestinal: Negative.   Genitourinary: Negative.   Musculoskeletal: Negative.   Neurological: Negative.   Psychiatric/Behavioral: Negative.   All other systems reviewed and are negative.      Objective:   Physical Exam Vitals signs and nursing note reviewed.  Constitutional:      Appearance: Normal appearance.  Cardiovascular:     Rate and Rhythm: Normal rate and regular rhythm.     Heart sounds: Normal heart sounds.  Pulmonary:     Effort: Pulmonary effort is normal.     Breath sounds: Normal breath sounds.  Abdominal:     General: Abdomen is flat. Bowel sounds are normal.     Palpations: Abdomen is soft.     Tenderness: There is abdominal tenderness (LLQ). There is guarding. There is no rebound.  Musculoskeletal: Normal range of motion.  Skin:    General: Skin is warm.  Neurological:     General: No focal deficit present.     Mental Status: She is alert and oriented to person, place, and time.  Psychiatric:        Mood and Affect: Mood normal.        Behavior: Behavior normal.    BP 115/68   Pulse 79   Temp 97.9 F (36.6 C) (Oral)   Ht 5\' 3"  (1.6 m)   Wt 142 lb (64.4 kg)   BMI 25.15 kg/m       Assessment & Plan:  Carla Moreno in today with chief complaint of left lower abdominal  pain   1. Left lower quadrant pain - DG Abd 1 View; Future  2. Diverticulitis of large intestine without perforation or abscess without bleeding Probable diverticulitis- do not want to send to hospital for further testing today Will treat with antibiotics and if no better by Thursday morning we will think about further testing. - ciprofloxacin (CIPRO) 500 MG tablet; Take 1 tablet (500 mg total) by mouth 2 (two) times daily.  Dispense: 20 tablet; Refill: 0 - metroNIDAZOLE (FLAGYL) 500 MG tablet; Take 1 tablet (500 mg total) by mouth 2 (two) times daily.  Dispense: 20 tablet; Refill: 0  Mary-Margaret Hassell Done, FNP

## 2019-03-20 ENCOUNTER — Telehealth: Payer: Self-pay | Admitting: Nurse Practitioner

## 2019-03-20 NOTE — Telephone Encounter (Signed)
Please review and advise.

## 2019-03-20 NOTE — Telephone Encounter (Signed)
This patient does have a history of diverticulosis based on a colonoscopy in November 2012.  If she continues to have blood in the stool.  I would assume that this is bright red blood?.  Regardless, she should have a follow-up visit sooner rather than later with a gastroenterologist because of this.  She also probably needs a CBC to check her hemoglobin.  Please schedule outpatient visit with gastroenterologist and he can make a decision about the urgency of repeating the colonoscopy whether sooner or later.  Either he can obtain the CBC or we can get that.

## 2019-07-04 ENCOUNTER — Encounter: Payer: Self-pay | Admitting: *Deleted

## 2019-10-09 ENCOUNTER — Encounter: Payer: Self-pay | Admitting: Family Medicine

## 2019-10-09 ENCOUNTER — Ambulatory Visit (INDEPENDENT_AMBULATORY_CARE_PROVIDER_SITE_OTHER): Payer: Managed Care, Other (non HMO) | Admitting: Family Medicine

## 2019-10-09 DIAGNOSIS — J0100 Acute maxillary sinusitis, unspecified: Secondary | ICD-10-CM

## 2019-10-09 MED ORDER — AMOXICILLIN-POT CLAVULANATE 875-125 MG PO TABS: 1.0000 | ORAL_TABLET | Freq: Two times a day (BID) | ORAL | 0 refills | Status: DC

## 2019-10-09 MED ORDER — PSEUDOEPHEDRINE-GUAIFENESIN ER 120-1200 MG PO TB12: 1.0000 | ORAL_TABLET | Freq: Two times a day (BID) | ORAL | 0 refills | Status: DC

## 2019-10-09 NOTE — Progress Notes (Signed)
    Subjective:    Patient ID: Carla Moreno, female    DOB: Oct 30, 1961, 58 y.o.   MRN: [id number]   HPI: Carla Moreno is a 58 y.o. female presenting for Patient presents with upper respiratory congestion. Head feels tight. No rhinorrhea or sore throat. Patient she is not coughing. There is no fever, chills or sweats. Fluid in the left ear. Having tinnitus as well. The patient denies being short of breath. Onset was yesterday morning. A little off balance.[facility] OTCs without improvement.    Depression screen [facility] 2/9 03/18/2019 01/27/2019 10/18/2018 01/24/2018 10/09/2016  Decreased Interest 0 0 0 0 0  Down, Depressed, Hopeless 0 0 0 0 0  PHQ - 2 Score 0 0 0 0 0     Relevant past medical, surgical, family and social history reviewed and updated as indicated.  Interim medical history since our last visit reviewed. Allergies and medications reviewed and updated.  ROS:  Review of Systems   Social History   Tobacco Use  Smoking Status Former Smoker  . Packs/day: 0.50  . Types: Cigarettes  . Start date: 12/18/1976  . Quit date: 12/18/1986  . Years since quitting: 32.8  Smokeless Tobacco Never Used       Objective:     Wt Readings from Last 3 Encounters:  03/18/19 142 lb (64.4 kg)  02/06/19 143 lb 6.4 oz (65 kg)  01/27/19 143 lb (64.9 kg)     Exam deferred. Pt. Harboring due to COVID 19. Phone visit performed.   Assessment & Plan:   1. Acute maxillary sinusitis, recurrence not specified     Meds ordered this encounter  Medications  . amoxicillin-clavulanate (AUGMENTIN) 875-125 MG tablet    Sig: Take 1 tablet by mouth 2 (two) times daily. Take all of this medication    Dispense:  20 tablet    Refill:  0  . Pseudoephedrine-Guaifenesin [phone] MG TB12    Sig: Take 1 tablet by mouth 2 (two) times daily. For congestion    Dispense:  20 tablet    Refill:  0    No orders of the defined types were placed in this encounter.     Diagnoses and all orders for this  visit:  Acute maxillary sinusitis, recurrence not specified  Other orders -     amoxicillin-clavulanate (AUGMENTIN) 875-125 MG tablet; Take 1 tablet by mouth 2 (two) times daily. Take all of this medication -     Pseudoephedrine-Guaifenesin [phone] MG TB12; Take 1 tablet by mouth 2 (two) times daily. For congestion    Virtual Visit via telephone Note  I discussed the limitations, risks, security and privacy concerns of performing an evaluation and management service by telephone and the availability of in person appointments. The patient was identified with two identifiers. Pt.expressed understanding and agreed to proceed. Pt. Is at home. Dr. Livia Snellen is in his office.  Follow Up Instructions:   I discussed the assessment and treatment plan with the patient. The patient was provided an opportunity to ask questions and all were answered. The patient agreed with the plan and demonstrated an understanding of the instructions.   The patient was advised to call back or seek an in-person evaluation if the symptoms worsen or if the condition fails to improve as anticipated.   Total minutes including chart review and phone contact time: 12   Follow up plan: Return if symptoms worsen or fail to improve.  [name], MD [facility]

## 2019-11-06 ENCOUNTER — Encounter: Payer: Self-pay | Admitting: Nurse Practitioner

## 2019-11-06 ENCOUNTER — Ambulatory Visit (INDEPENDENT_AMBULATORY_CARE_PROVIDER_SITE_OTHER): Payer: Managed Care, Other (non HMO) | Admitting: Nurse Practitioner

## 2019-11-06 DIAGNOSIS — K5732 Diverticulitis of large intestine without perforation or abscess without bleeding: Secondary | ICD-10-CM | POA: Diagnosis not present

## 2019-11-06 MED ORDER — CIPROFLOXACIN HCL 500 MG PO TABS: 500.0000 mg | ORAL_TABLET | Freq: Two times a day (BID) | ORAL | 0 refills | Status: DC

## 2019-11-06 MED ORDER — METRONIDAZOLE 500 MG PO TABS: 500.0000 mg | ORAL_TABLET | Freq: Two times a day (BID) | ORAL | 0 refills | Status: DC

## 2019-11-06 NOTE — Progress Notes (Signed)
Virtual Visit via telephone Note Due to COVID-19 pandemic this visit was conducted virtually. This visit type was conducted due to national recommendations for restrictions regarding the COVID-19 Pandemic (e.g. social distancing, sheltering in place) in an effort to limit this patient's exposure and mitigate transmission in our community. All issues noted in this document were discussed and addressed.  A physical exam was not performed with this format.  I connected with Carla Moreno on 11/06/19 at 8:25 by telephone and verified that I am speaking with the correct person using two identifiers. Carla Moreno is currently located at home and no one is currently with her during visit. The provider, Mary-Margaret Hassell Done, FNP is located in their office at time of visit.  I discussed the limitations, risks, security and privacy concerns of performing an evaluation and management service by telephone and the availability of in person appointments. I also discussed with the patient that there may be a patient responsible charge related to this service. The patient expressed understanding and agreed to proceed.   History and Present Illness:   Chief Complaint: Abdominal Pain   HPI Patient calls in today c/o lower left abdominal pain. Is the samething that she had in March and was diagnosed with diverticulitis. Pain started all the sudden last night and her stool looks watery and contains mucus.    Review of Systems  Constitutional: Negative for diaphoresis and weight loss.  Eyes: Negative for blurred vision, double vision and pain.  Respiratory: Negative for shortness of breath.   Cardiovascular: Negative for chest pain, palpitations, orthopnea and leg swelling.  Gastrointestinal: Positive for diarrhea. Negative for abdominal pain and blood in stool.  Skin: Negative for rash.  Neurological: Negative for dizziness, sensory change, loss of consciousness, weakness and headaches.   Endo/Heme/Allergies: Negative for polydipsia. Does not bruise/bleed easily.  Psychiatric/Behavioral: Negative for memory loss. The patient does not have insomnia.   All other systems reviewed and are negative.    Observations/Objective: Alert and oriented- answers all questions appropriately mild distress Mild abdominal pain left lower quadrant on palpation.   Assessment and Plan: ADRIENA PHENIS in today with chief complaint of Abdominal Pain   1. Diverticulitis of large intestine without perforation or abscess without bleeding Discussed diet- avid foods with seeds, and undigestibles skin - metroNIDAZOLE (FLAGYL) 500 MG tablet; Take 1 tablet (500 mg total) by mouth 2 (two) times daily.  Dispense: 20 tablet; Refill: 0 - ciprofloxacin (CIPRO) 500 MG tablet; Take 1 tablet (500 mg total) by mouth 2 (two) times daily.  Dispense: 20 tablet; Refill: 0   Follow Up Instructions: prn    I discussed the assessment and treatment plan with the patient. The patient was provided an opportunity to ask questions and all were answered. The patient agreed with the plan and demonstrated an understanding of the instructions.   The patient was advised to call back or seek an in-person evaluation if the symptoms worsen or if the condition fails to improve as anticipated.  The above assessment and management plan was discussed with the patient. The patient verbalized understanding of and has agreed to the management plan. Patient is aware to call the clinic if symptoms persist or worsen. Patient is aware when to return to the clinic for a follow-up visit. Patient educated on when it is appropriate to go to the emergency department.   Time call ended:  8:40 I provided 15 minutes of non-face-to-face time during this encounter.    Mary-Margaret Hassell Done, FNP

## 2020-01-30 ENCOUNTER — Other Ambulatory Visit: Payer: Self-pay | Admitting: Obstetrics and Gynecology

## 2020-01-30 DIAGNOSIS — Z1231 Encounter for screening mammogram for malignant neoplasm of breast: Secondary | ICD-10-CM

## 2020-02-04 ENCOUNTER — Other Ambulatory Visit: Payer: Self-pay

## 2020-02-05 ENCOUNTER — Ambulatory Visit: Payer: Managed Care, Other (non HMO) | Admitting: Family Medicine

## 2020-02-05 ENCOUNTER — Encounter: Payer: Managed Care, Other (non HMO) | Admitting: Family Medicine

## 2020-02-12 ENCOUNTER — Other Ambulatory Visit: Payer: Self-pay

## 2020-02-13 ENCOUNTER — Encounter: Payer: Self-pay | Admitting: Family Medicine

## 2020-02-13 ENCOUNTER — Ambulatory Visit (INDEPENDENT_AMBULATORY_CARE_PROVIDER_SITE_OTHER): Payer: 59 | Admitting: Family Medicine

## 2020-02-13 VITALS — BP 123/70 | HR 68 | Temp 98.4°F | Resp 20 | Ht 63.0 in | Wt 145.0 lb

## 2020-02-13 DIAGNOSIS — E7800 Pure hypercholesterolemia, unspecified: Secondary | ICD-10-CM

## 2020-02-13 DIAGNOSIS — Z0000 Encounter for general adult medical examination without abnormal findings: Secondary | ICD-10-CM

## 2020-02-13 DIAGNOSIS — Z0001 Encounter for general adult medical examination with abnormal findings: Secondary | ICD-10-CM

## 2020-02-13 DIAGNOSIS — E559 Vitamin D deficiency, unspecified: Secondary | ICD-10-CM

## 2020-02-13 DIAGNOSIS — Z13820 Encounter for screening for osteoporosis: Secondary | ICD-10-CM

## 2020-02-13 DIAGNOSIS — Z780 Asymptomatic menopausal state: Secondary | ICD-10-CM

## 2020-02-13 DIAGNOSIS — Z6825 Body mass index (BMI) 25.0-25.9, adult: Secondary | ICD-10-CM

## 2020-02-13 DIAGNOSIS — N951 Menopausal and female climacteric states: Secondary | ICD-10-CM | POA: Insufficient documentation

## 2020-02-13 DIAGNOSIS — L309 Dermatitis, unspecified: Secondary | ICD-10-CM | POA: Insufficient documentation

## 2020-02-13 NOTE — Patient Instructions (Signed)
Health Maintenance, Female Adopting a healthy lifestyle and getting preventive care are important in promoting health and wellness. Ask your health care provider about:  The right schedule for you to have regular tests and exams.  Things you can do on your own to prevent diseases and keep yourself healthy. What should I know about diet, weight, and exercise? Eat a healthy diet   Eat a diet that includes plenty of vegetables, fruits, low-fat dairy products, and lean protein.  Do not eat a lot of foods that are high in solid fats, added sugars, or sodium. Maintain a healthy weight Body mass index (BMI) is used to identify weight problems. It estimates body fat based on height and weight. Your health care provider can help determine your BMI and help you achieve or maintain a healthy weight. Get regular exercise Get regular exercise. This is one of the most important things you can do for your health. Most adults should:  Exercise for at least 150 minutes each week. The exercise should increase your heart rate and make you sweat (moderate-intensity exercise).  Do strengthening exercises at least twice a week. This is in addition to the moderate-intensity exercise.  Spend less time sitting. Even light physical activity can be beneficial. Watch cholesterol and blood lipids Have your blood tested for lipids and cholesterol at 59 years of age, then have this test every 5 years. Have your cholesterol levels checked more often if:  Your lipid or cholesterol levels are high.  You are older than 59 years of age.  You are at high risk for heart disease. What should I know about cancer screening? Depending on your health history and family history, you may need to have cancer screening at various ages. This may include screening for:  Breast cancer.  Cervical cancer.  Colorectal cancer.  Skin cancer.  Lung cancer. What should I know about heart disease, diabetes, and high blood  pressure? Blood pressure and heart disease  High blood pressure causes heart disease and increases the risk of stroke. This is more likely to develop in people who have high blood pressure readings, are of African descent, or are overweight.  Have your blood pressure checked: ? Every 3-5 years if you are 18-39 years of age. ? Every year if you are 40 years old or older. Diabetes Have regular diabetes screenings. This checks your fasting blood sugar level. Have the screening done:  Once every three years after age 40 if you are at a normal weight and have a low risk for diabetes.  More often and at a younger age if you are overweight or have a high risk for diabetes. What should I know about preventing infection? Hepatitis B If you have a higher risk for hepatitis B, you should be screened for this virus. Talk with your health care provider to find out if you are at risk for hepatitis B infection. Hepatitis C Testing is recommended for:  Everyone born from 1945 through 1965.  Anyone with known risk factors for hepatitis C. Sexually transmitted infections (STIs)  Get screened for STIs, including gonorrhea and chlamydia, if: ? You are sexually active and are younger than 59 years of age. ? You are older than 59 years of age and your health care provider tells you that you are at risk for this type of infection. ? Your sexual activity has changed since you were last screened, and you are at increased risk for chlamydia or gonorrhea. Ask your health care provider if   you are at risk.  Ask your health care provider about whether you are at high risk for HIV. Your health care provider may recommend a prescription medicine to help prevent HIV infection. If you choose to take medicine to prevent HIV, you should first get tested for HIV. You should then be tested every 3 months for as long as you are taking the medicine. Pregnancy  If you are about to stop having your period (premenopausal) and  you may become pregnant, seek counseling before you get pregnant.  Take 400 to 800 micrograms (mcg) of folic acid every day if you become pregnant.  Ask for birth control (contraception) if you want to prevent pregnancy. Osteoporosis and menopause Osteoporosis is a disease in which the bones lose minerals and strength with aging. This can result in bone fractures. If you are 65 years old or older, or if you are at risk for osteoporosis and fractures, ask your health care provider if you should:  Be screened for bone loss.  Take a calcium or vitamin D supplement to lower your risk of fractures.  Be given hormone replacement therapy (HRT) to treat symptoms of menopause. Follow these instructions at home: Lifestyle  Do not use any products that contain nicotine or tobacco, such as cigarettes, e-cigarettes, and chewing tobacco. If you need help quitting, ask your health care provider.  Do not use street drugs.  Do not share needles.  Ask your health care provider for help if you need support or information about quitting drugs. Alcohol use  Do not drink alcohol if: ? Your health care provider tells you not to drink. ? You are pregnant, may be pregnant, or are planning to become pregnant.  If you drink alcohol: ? Limit how much you use to 0-1 drink a day. ? Limit intake if you are breastfeeding.  Be aware of how much alcohol is in your drink. In the [place] one drink equals one 12 oz bottle of beer (355 mL), one 5 oz glass of wine (148 mL), or one 1 oz glass of hard liquor (44 mL). General instructions  Schedule regular health, dental, and eye exams.  Stay current with your vaccines.  Tell your health care provider if: ? You often feel depressed. ? You have ever been abused or do not feel safe at home. Summary  Adopting a healthy lifestyle and getting preventive care are important in promoting health and wellness.  Follow your health care provider's instructions about healthy  diet, exercising, and getting tested or screened for diseases.  Follow your health care provider's instructions on monitoring your cholesterol and blood pressure. This information is not intended to replace advice given to you by your health care provider. Make sure you discuss any questions you have with your health care provider. Document Revised: 11/27/2018 Document Reviewed: 11/27/2018 [employer] Patient Education  2020 [employer]  

## 2020-02-13 NOTE — Progress Notes (Signed)
Subjective:  Patient ID: Carla Moreno, female    DOB: 17-Sep-1961, 59 y.o.   MRN: [id number]  Patient Care Team: Baruch Gouty, FNP as PCP - General (Family Medicine)   Chief Complaint:  Annual Exam   HPI: Carla Moreno is a 59 y.o. female presenting on 02/13/2020 for Annual Exam   Pt here today for annual wellness exam. Pt states she has been doing very well. She has changed her diet to help lower her cholesterol. She does walk daily. She has been taking supplements on a daily basis and is tolerating them well. She has cut out red meats, may have once per month. She is eating more healthy foods along with healthy fats. She does have a glass of wine occasionally. No changes in vision or hearing. Does have less frequent bowel movements but has been taking Miralax that is helpful. No other complaints or concerns. Is scheduled for PAP and mammogram. Will have DEXA today.      Relevant past medical, surgical, family, and social history reviewed and updated as indicated.  Allergies and medications reviewed and updated. Date reviewed: Chart in Epic.   Past Medical History:  Diagnosis Date  . Hyperlipidemia   . Migraines     Past Surgical History:  Procedure Laterality Date  . ABDOMINAL HYSTERECTOMY  2001  . CESAREAN SECTION     2 occasions    Social History   Socioeconomic History  . Marital status: Married    Spouse name: Not on file  . Number of children: Not on file  . Years of education: Not on file  . Highest education level: Not on file  Occupational History  . Not on file  Tobacco Use  . Smoking status: Former Smoker    Packs/day: 0.50    Types: Cigarettes    Start date: 12/18/1976    Quit date: 12/18/1986    Years since quitting: 33.1  . Smokeless tobacco: Never Used  Substance and Sexual Activity  . Alcohol use: Yes    Alcohol/week: 2.0 standard drinks    Types: 2 Glasses of wine per week  . Drug use: No  . Sexual activity: Not on file  Other  Topics Concern  . Not on file  Social History Narrative  . Not on file   Social Determinants of Health   Financial Resource Strain:   . Difficulty of Paying Living Expenses: Not on file  Food Insecurity:   . Worried About Charity fundraiser in the Last Year: Not on file  . Ran Out of Food in the Last Year: Not on file  Transportation Needs:   . Lack of Transportation (Medical): Not on file  . Lack of Transportation (Non-Medical): Not on file  Physical Activity:   . Days of Exercise per Week: Not on file  . Minutes of Exercise per Session: Not on file  Stress:   . Feeling of Stress : Not on file  Social Connections:   . Frequency of Communication with Friends and Family: Not on file  . Frequency of Social Gatherings with Friends and Family: Not on file  . Attends Religious Services: Not on file  . Active Member of Clubs or Organizations: Not on file  . Attends Archivist Meetings: Not on file  . Marital Status: Not on file  Intimate Partner Violence:   . Fear of Current or Ex-Partner: Not on file  . Emotionally Abused: Not on file  . Physically Abused:  Not on file  . Sexually Abused: Not on file    Outpatient Encounter Medications as of 02/13/2020  Medication Sig  . Omega 3 1000 MG CAPS Take by mouth.  . SUMAtriptan (IMITREX) 100 MG tablet Take 1 tablet (100 mg total) by mouth every 2 (two) hours as needed for migraine. Do not exceed 2 tabs per 24 hours (Patient not taking: Reported on 02/13/2020)  . UNABLE TO FIND Med Name:  Cholest-off  . [DISCONTINUED] amoxicillin-clavulanate (AUGMENTIN) 875-125 MG tablet Take 1 tablet by mouth 2 (two) times daily. Take all of this medication  . [DISCONTINUED] ciprofloxacin (CIPRO) 500 MG tablet Take 1 tablet (500 mg total) by mouth 2 (two) times daily.  . [DISCONTINUED] metroNIDAZOLE (FLAGYL) 500 MG tablet Take 1 tablet (500 mg total) by mouth 2 (two) times daily.  . [DISCONTINUED] Pseudoephedrine-Guaifenesin [phone] MG TB12  Take 1 tablet by mouth 2 (two) times daily. For congestion   No facility-administered encounter medications on file as of 02/13/2020.    No Known Allergies  Review of Systems  Constitutional: Negative for activity change, appetite change, chills, diaphoresis, fatigue, fever and unexpected weight change.  Eyes: Negative.  Negative for photophobia and visual disturbance.  Respiratory: Negative for cough, chest tightness and shortness of breath.   Cardiovascular: Negative for chest pain, palpitations and leg swelling.  Gastrointestinal: Negative for abdominal pain, blood in stool, constipation, diarrhea, nausea and vomiting.  Endocrine: Negative.   Genitourinary: Negative for decreased urine volume, difficulty urinating, dysuria, frequency, urgency, vaginal bleeding, vaginal discharge and vaginal pain.  Musculoskeletal: Negative for arthralgias and myalgias.  Skin: Negative.   Allergic/Immunologic: Negative.   Neurological: Negative for dizziness, tremors, seizures, syncope, facial asymmetry, speech difficulty, weakness, light-headedness, numbness and headaches.  Hematological: Negative.   Psychiatric/Behavioral: Negative for confusion, hallucinations, sleep disturbance and suicidal ideas.  All other systems reviewed and are negative.       Objective:  BP 123/70   Pulse 68   Temp 98.4 F (36.9 C)   Resp 20   Ht 5' 3"  (1.6 m)   Wt 145 lb (65.8 kg)   SpO2 100%   BMI 25.69 kg/m    Wt Readings from Last 3 Encounters:  02/13/20 145 lb (65.8 kg)  03/18/19 142 lb (64.4 kg)  02/06/19 143 lb 6.4 oz (65 kg)    Physical Exam Vitals and nursing note reviewed.  Constitutional:      General: She is not in acute distress.    Appearance: Normal appearance. She is well-developed and well-groomed. She is not ill-appearing, toxic-appearing or diaphoretic.  HENT:     Head: Normocephalic and atraumatic.     Jaw: There is normal jaw occlusion.     Right Ear: Hearing, tympanic membrane, ear  canal and external ear normal.     Left Ear: Hearing, tympanic membrane, ear canal and external ear normal.     Nose: Nose normal.     Mouth/Throat:     Lips: Pink.     Mouth: Mucous membranes are moist.     Pharynx: Oropharynx is clear. Uvula midline.  Eyes:     General: Lids are normal.     Extraocular Movements: Extraocular movements intact.     Conjunctiva/sclera: Conjunctivae normal.     Pupils: Pupils are equal, round, and reactive to light.  Neck:     Thyroid: No thyroid mass, thyromegaly or thyroid tenderness.     Vascular: No carotid bruit or JVD.     Trachea: Trachea and phonation normal.  Cardiovascular:     Rate and Rhythm: Normal rate and regular rhythm.     Chest Wall: PMI is not displaced.     Pulses: Normal pulses.     Heart sounds: Normal heart sounds. No murmur. No friction rub. No gallop.   Pulmonary:     Effort: Pulmonary effort is normal. No respiratory distress.     Breath sounds: Normal breath sounds. No wheezing.  Abdominal:     General: Bowel sounds are normal. There is no distension or abdominal bruit.     Palpations: Abdomen is soft. There is no hepatomegaly or splenomegaly.     Tenderness: There is no abdominal tenderness. There is no right CVA tenderness or left CVA tenderness.     Hernia: No hernia is present.  Musculoskeletal:        General: Normal range of motion.     Cervical back: Normal range of motion and neck supple.     Right lower leg: No edema.     Left lower leg: No edema.  Lymphadenopathy:     Cervical: No cervical adenopathy.  Skin:    General: Skin is warm and dry.     Capillary Refill: Capillary refill takes less than 2 seconds.     Coloration: Skin is not cyanotic, jaundiced or pale.     Findings: No rash.  Neurological:     General: No focal deficit present.     Mental Status: She is alert and oriented to person, place, and time.     Cranial Nerves: Cranial nerves are intact. No cranial nerve deficit.     Sensory: Sensation  is intact. No sensory deficit.     Motor: Motor function is intact. No weakness.     Coordination: Coordination is intact. Coordination normal.     Gait: Gait is intact. Gait normal.     Deep Tendon Reflexes: Reflexes are normal and symmetric. Reflexes normal.  Psychiatric:        Attention and Perception: Attention and perception normal.        Mood and Affect: Mood and affect normal.        Speech: Speech normal.        Behavior: Behavior normal. Behavior is cooperative.        Thought Content: Thought content normal.        Cognition and Memory: Cognition and memory normal.        Judgment: Judgment normal.     Results for orders placed or performed in visit on 01/27/19  [facility]  Result Value Ref Range   Glucose 87 65 - 99 mg/dL   BUN 12 6 - 24 mg/dL   Creatinine, Ser 0.62 0.57 - 1.00 mg/dL   GFR calc non Af Amer 100 >59 mL/min/1.73   GFR calc Af Amer 115 >59 mL/min/1.73   BUN/Creatinine Ratio 19 9 - 23   Sodium 141 134 - 144 mmol/L   Potassium 4.0 3.5 - 5.2 mmol/L   Chloride 102 96 - 106 mmol/L   CO2 23 20 - 29 mmol/L   Calcium 9.4 8.7 - 10.2 mg/dL  CBC with Differential/Platelet  Result Value Ref Range   WBC 5.8 3.4 - 10.8 x10E3/uL   RBC 4.11 3.77 - 5.28 x10E6/uL   Hemoglobin 12.7 11.1 - 15.9 g/dL   Hematocrit 37.4 34.0 - 46.6 %   MCV 91 79 - 97 fL   MCH 30.9 26.6 - 33.0 pg   MCHC 34.0 31.5 - 35.7 g/dL   RDW 12.8  11.7 - 15.4 %   Platelets 241 150 - 450 x10E3/uL   Neutrophils 49 Not Estab. %   Lymphs 40 Not Estab. %   Monocytes 7 Not Estab. %   Eos 3 Not Estab. %   Basos 1 Not Estab. %   Neutrophils Absolute 2.9 1.4 - 7.0 x10E3/uL   Lymphocytes Absolute 2.3 0.7 - 3.1 x10E3/uL   Monocytes Absolute 0.4 0.1 - 0.9 x10E3/uL   EOS (ABSOLUTE) 0.2 0.0 - 0.4 x10E3/uL   Basophils Absolute 0.0 0.0 - 0.2 x10E3/uL   Immature Granulocytes 0 Not Estab. %   Immature Grans (Abs) 0.0 0.0 - 0.1 x10E3/uL  Lipid panel  Result Value Ref Range   Cholesterol, Total 202 (H) 100 -  199 mg/dL   Triglycerides 72 0 - 149 mg/dL   HDL 56 >39 mg/dL   VLDL Cholesterol Cal 14 5 - 40 mg/dL   LDL Calculated 132 (H) 0 - 99 mg/dL   Chol/HDL Ratio 3.6 0.0 - 4.4 ratio  Thyroid Panel With TSH  Result Value Ref Range   TSH 2.490 0.450 - 4.500 uIU/mL   T4, Total 7.4 4.5 - 12.0 ug/dL   T3 Uptake Ratio 24 24 - 39 %   Free Thyroxine Index 1.8 1.2 - 4.9  Hepatic function panel  Result Value Ref Range   Total Protein 7.2 6.0 - 8.5 g/dL   Albumin 4.6 3.8 - 4.9 g/dL   Bilirubin Total 0.9 0.0 - 1.2 mg/dL   Bilirubin, Direct 0.15 0.00 - 0.40 mg/dL   Alkaline Phosphatase 56 39 - 117 IU/L   AST 20 0 - 40 IU/L   ALT 22 0 - 32 IU/L       Pertinent labs & imaging results that were available during my care of the patient were reviewed by me and considered in my medical decision making.  Assessment & Plan:  Jenette was seen today for annual exam.  Diagnoses and all orders for this visit:  Annual physical exam PAP with gyn. Mammogram scheduled. Health maintenance discussed in detail. Diet and exercise encouraged. Labs pending. DEXA ordered.  -     DG [facility] DEXA; Future -     CBC with Differential/Platelet -     CMP14+EGFR -     Lipid panel -     Thyroid Panel With TSH -     VITAMIN D 25 Hydroxy (Vit-D Deficiency, Fractures)  Screening for osteoporosis -     DG [facility] DEXA; Future  Postmenopausal -     DG [facility] DEXA; Future  Pure hypercholesterolemia Diet and exercise encouraged. Labs pending.  -     Lipid panel  Vitamin D deficiency Labs pending. Continue repletion therapy. If indicated, will change repletion dosage. Eat foods rich in Vit D including milk, orange juice, yogurt with vitamin D added, salmon or mackerel, canned tuna fish, cereals with vitamin D added, and cod liver oil. Get out in the sun but make sure to wear at least SPF 30 sunscreen.  -     VITAMIN D 25 Hydroxy (Vit-D Deficiency, Fractures)  BMI 25.0-25.9,adult Diet and exercise encouraged. Labs pending.  -      CBC with Differential/Platelet -     CMP14+EGFR -     Lipid panel -     Thyroid Panel With TSH     Continue all other maintenance medications.  Follow up plan: Return if symptoms worsen or fail to improve.  Continue healthy lifestyle choices, including diet (rich in fruits, vegetables, and  lean proteins, and low in salt and simple carbohydrates) and exercise (at least 30 minutes of moderate physical activity daily).  Educational handout given for health maintenance   The above assessment and management plan was discussed with the patient. The patient verbalized understanding of and has agreed to the management plan. Patient is aware to call the clinic if they develop any new symptoms or if symptoms persist or worsen. Patient is aware when to return to the clinic for a follow-up visit. Patient educated on when it is appropriate to go to the emergency department.   Monia Pouch, FNP-C [facility] [phone]

## 2020-02-14 LAB — CBC WITH DIFFERENTIAL/PLATELET
Basophils Absolute: 0.1 10*3/uL (ref 0.0–0.2)
Basos: 1 %
EOS (ABSOLUTE): 0.2 10*3/uL (ref 0.0–0.4)
Eos: 4 %
Hematocrit: 37.5 % (ref 34.0–46.6)
Hemoglobin: 12.8 g/dL (ref 11.1–15.9)
Immature Grans (Abs): 0 10*3/uL (ref 0.0–0.1)
Immature Granulocytes: 0 %
Lymphocytes Absolute: 1.9 10*3/uL (ref 0.7–3.1)
Lymphs: 32 %
MCH: 31.1 pg (ref 26.6–33.0)
MCHC: 34.1 g/dL (ref 31.5–35.7)
MCV: 91 fL (ref 79–97)
Monocytes Absolute: 0.4 10*3/uL (ref 0.1–0.9)
Monocytes: 8 %
Neutrophils Absolute: 3.2 10*3/uL (ref 1.4–7.0)
Neutrophils: 55 %
Platelets: 246 10*3/uL (ref 150–450)
RBC: 4.12 x10E6/uL (ref 3.77–5.28)
RDW: 13.8 % (ref 11.7–15.4)
WBC: 5.8 10*3/uL (ref 3.4–10.8)

## 2020-02-14 LAB — CMP14+EGFR
ALT: 22 IU/L (ref 0–32)
AST: 35 IU/L (ref 0–40)
Albumin/Globulin Ratio: 2 (ref 1.2–2.2)
Albumin: 4.7 g/dL (ref 3.8–4.9)
Alkaline Phosphatase: 64 IU/L (ref 39–117)
BUN/Creatinine Ratio: 19 (ref 9–23)
BUN: 13 mg/dL (ref 6–24)
Bilirubin Total: 1 mg/dL (ref 0.0–1.2)
CO2: 24 mmol/L (ref 20–29)
Calcium: 9.5 mg/dL (ref 8.7–10.2)
Chloride: 106 mmol/L (ref 96–106)
Creatinine, Ser: 0.68 mg/dL (ref 0.57–1.00)
GFR calc Af Amer: 111 mL/min/{1.73_m2} (ref >59)
GFR calc non Af Amer: 96 mL/min/{1.73_m2} (ref >59)
Globulin, Total: 2.3 g/dL (ref 1.5–4.5)
Glucose: 79 mg/dL (ref 65–99)
Potassium: 4.1 mmol/L (ref 3.5–5.2)
Sodium: 145 mmol/L — ABNORMAL HIGH (ref 134–144)
Total Protein: 7 g/dL (ref 6.0–8.5)

## 2020-02-14 LAB — LIPID PANEL
Chol/HDL Ratio: 4.2 ratio (ref 0.0–4.4)
Cholesterol, Total: 216 mg/dL — ABNORMAL HIGH (ref 100–199)
HDL: 52 mg/dL (ref >39)
LDL Chol Calc (NIH): 151 mg/dL — ABNORMAL HIGH (ref 0–99)
Triglycerides: 75 mg/dL (ref 0–149)
VLDL Cholesterol Cal: 13 mg/dL (ref 5–40)

## 2020-02-14 LAB — THYROID PANEL WITH TSH
Free Thyroxine Index: 2.1 (ref 1.2–4.9)
T3 Uptake Ratio: 26 % (ref 24–39)
T4, Total: 7.9 ug/dL (ref 4.5–12.0)
TSH: 1.7 u[IU]/mL (ref 0.450–4.500)

## 2020-02-14 LAB — VITAMIN D 25 HYDROXY (VIT D DEFICIENCY, FRACTURES): Vit D, 25-Hydroxy: 39.5 ng/mL (ref 30.0–100.0)

## 2020-02-20 ENCOUNTER — Telehealth: Payer: Self-pay

## 2020-02-20 ENCOUNTER — Ambulatory Visit: Payer: 59 | Attending: Internal Medicine

## 2020-02-20 DIAGNOSIS — Z23 Encounter for immunization: Secondary | ICD-10-CM | POA: Insufficient documentation

## 2020-02-20 NOTE — Telephone Encounter (Signed)
error 

## 2020-02-20 NOTE — Progress Notes (Signed)
   Covid-19 Vaccination Clinic  Name:  Carla Moreno    MRN: [id number] DOB: 03-24-1961  02/20/2020  [name] was observed post Covid-19 immunization for 15 minutes without incident. She was provided with Vaccine Information Sheet and instruction to access the V-Safe system.   [name] was instructed to call 911 with any severe reactions post vaccine: Marland Kitchen Difficulty breathing  . Swelling of face and throat  . A fast heartbeat  . A bad rash all over body  . Dizziness and weakness   Immunizations Administered    Name Date Dose VIS Date Route   Moderna COVID-19 Vaccine 02/20/2020  8:21 AM 0.5 mL 11/18/2019 Intramuscular   Manufacturer: Moderna   Lot: [id number]   [id number]

## 2020-02-25 ENCOUNTER — Other Ambulatory Visit: Payer: Self-pay

## 2020-02-25 ENCOUNTER — Ambulatory Visit (INDEPENDENT_AMBULATORY_CARE_PROVIDER_SITE_OTHER): Payer: 59

## 2020-02-25 DIAGNOSIS — Z0000 Encounter for general adult medical examination without abnormal findings: Secondary | ICD-10-CM

## 2020-02-25 DIAGNOSIS — Z13820 Encounter for screening for osteoporosis: Secondary | ICD-10-CM

## 2020-02-25 DIAGNOSIS — Z780 Asymptomatic menopausal state: Secondary | ICD-10-CM

## 2020-03-01 ENCOUNTER — Telehealth: Payer: Self-pay | Admitting: Family Medicine

## 2020-03-01 NOTE — Telephone Encounter (Signed)
Aware of dexa results.

## 2020-03-05 ENCOUNTER — Ambulatory Visit: Payer: BLUE CROSS/BLUE SHIELD

## 2020-03-05 ENCOUNTER — Other Ambulatory Visit: Payer: Self-pay

## 2020-03-05 ENCOUNTER — Ambulatory Visit
Admission: RE | Admit: 2020-03-05 | Discharge: 2020-03-05 | Disposition: A | Discharge status: Home / Self Care | Payer: 59 | Source: Ambulatory Visit | Attending: Obstetrics and Gynecology | Admitting: Obstetrics and Gynecology

## 2020-03-05 DIAGNOSIS — Z1231 Encounter for screening mammogram for malignant neoplasm of breast: Secondary | ICD-10-CM

## 2020-03-24 ENCOUNTER — Ambulatory Visit: Payer: 59 | Attending: Internal Medicine

## 2020-03-24 DIAGNOSIS — Z23 Encounter for immunization: Secondary | ICD-10-CM

## 2020-03-24 NOTE — Progress Notes (Signed)
   Covid-19 Vaccination Clinic  Name:  Carla Moreno    MRN: [id number] DOB: 1961-07-23  03/24/2020  [name] was observed post Covid-19 immunization for 15 minutes without incident. She was provided with Vaccine Information Sheet and instruction to access the V-Safe system.   [name] was instructed to call 911 with any severe reactions post vaccine: Marland Kitchen Difficulty breathing  . Swelling of face and throat  . A fast heartbeat  . A bad rash all over body  . Dizziness and weakness   Immunizations Administered    Name Date Dose VIS Date Route   Moderna COVID-19 Vaccine 03/24/2020  8:16 AM 0.5 mL 11/18/2019 Intramuscular   Manufacturer: Levan Hurst   [id number]   [place]DW:[id number]

## 2020-04-06 ENCOUNTER — Encounter: Payer: Self-pay | Admitting: *Deleted

## 2020-06-14 ENCOUNTER — Ambulatory Visit (INDEPENDENT_AMBULATORY_CARE_PROVIDER_SITE_OTHER): Payer: 59 | Admitting: Family Medicine

## 2020-06-14 ENCOUNTER — Other Ambulatory Visit: Payer: Self-pay

## 2020-06-14 ENCOUNTER — Encounter: Payer: Self-pay | Admitting: Family Medicine

## 2020-06-14 DIAGNOSIS — M810 Age-related osteoporosis without current pathological fracture: Secondary | ICD-10-CM | POA: Diagnosis not present

## 2020-06-14 DIAGNOSIS — J0100 Acute maxillary sinusitis, unspecified: Secondary | ICD-10-CM

## 2020-06-14 MED ORDER — DENOSUMAB 60 MG/ML ~~LOC~~ SOSY: 60.0000 mg | PREFILLED_SYRINGE | Freq: Once | SUBCUTANEOUS | 0 refills | Status: AC

## 2020-06-14 MED ORDER — FEXOFENADINE-PSEUDOEPHED ER 180-240 MG PO TB24: 1.0000 | ORAL_TABLET | Freq: Every evening | ORAL | 11 refills | Status: AC

## 2020-06-14 MED ORDER — AMOXICILLIN-POT CLAVULANATE 875-125 MG PO TABS: 1.0000 | ORAL_TABLET | Freq: Two times a day (BID) | ORAL | 0 refills | Status: DC

## 2020-06-14 NOTE — Progress Notes (Signed)
Subjective:    Patient ID: Carla Moreno, female    DOB: June 09, 1961, 59 y.o.   MRN: [id number]   HPI: Carla Moreno is a 59 y.o. female presenting for 5 days of increasingly worse sx. No relief with alka seltzer plus.   A lot of drainage and sore throat. No rhino. No brown mucous. Denies HA. No pressure in sinuses. Very congested. Feeling her neck, the lymph nodes are swollen.   Had 2nd CoVID shot 2 mos ago. Seems like her energy has been poor since then.    Pt. also asked that I review her recent DEXA scan.  She has been told by her previous provider who is no longer here that she should consider the shots every 6 months.  Patient would like to start those at this time.  She is reluctant to try the pills because of side effects particularly nausea.  I mentioned the name Prolia and she said yes that was what she wanted.  Depression screen [name] 2/9 02/13/2020 03/18/2019 01/27/2019 10/18/2018 01/24/2018  Decreased Interest 0 0 0 0 0  Down, Depressed, Hopeless 0 0 0 0 0  PHQ - 2 Score 0 0 0 0 0     Relevant past medical, surgical, family and social history reviewed and updated as indicated.  Interim medical history since our last visit reviewed. Allergies and medications reviewed and updated.  ROS:  Review of Systems  Constitutional: Negative for fever.  HENT: Positive for congestion, postnasal drip, sore throat and trouble swallowing. Negative for ear pain, rhinorrhea and sinus pressure.   Respiratory: Negative for chest tightness and shortness of breath.   Skin: Negative for rash.     Social History   Tobacco Use  Smoking Status Former Smoker  . Packs/day: 0.50  . Types: Cigarettes  . Start date: 12/18/1976  . Quit date: 12/18/1986  . Years since quitting: 33.5  Smokeless Tobacco Never Used       Objective:     Wt Readings from Last 3 Encounters:  02/13/20 145 lb (65.8 kg)  03/18/19 142 lb (64.4 kg)  02/06/19 143 lb 6.4 oz (65 kg)     Exam deferred. Pt.  [place] due to COVID 19. Phone visit performed.   Assessment & Plan:   1. Acute maxillary sinusitis, recurrence not specified   2. Age-related osteoporosis without current pathological fracture     Meds ordered this encounter  Medications  . amoxicillin-clavulanate (AUGMENTIN) 875-125 MG tablet    Sig: Take 1 tablet by mouth 2 (two) times daily. Take all of this medication    Dispense:  20 tablet    Refill:  0  . denosumab (PROLIA) 60 MG/ML SOSY injection    Sig: Inject 60 mg into the skin once for 1 dose.    Dispense:  1 mL    Refill:  0  . fexofenadine-pseudoephedrine (ALLEGRA-D 24) 180-240 MG 24 hr tablet    Sig: Take 1 tablet by mouth every evening. For allergy and congestion    Dispense:  30 tablet    Refill:  11    No orders of the defined types were placed in this encounter.     Diagnoses and all orders for this visit:  Acute maxillary sinusitis, recurrence not specified  Age-related osteoporosis without current pathological fracture  Other orders -     amoxicillin-clavulanate (AUGMENTIN) 875-125 MG tablet; Take 1 tablet by mouth 2 (two) times daily. Take all of this medication -  denosumab (PROLIA) 60 MG/ML SOSY injection; Inject 60 mg into the skin once for 1 dose. -     fexofenadine-pseudoephedrine (ALLEGRA-D 24) 180-240 MG 24 hr tablet; Take 1 tablet by mouth every evening. For allergy and congestion    Virtual Visit via telephone Note  I discussed the limitations, risks, security and privacy concerns of performing an evaluation and management service by telephone and the availability of in person appointments. The patient was identified with two identifiers. Pt.expressed understanding and agreed to proceed. Pt. Is at home. Dr. Livia Snellen is in his office.  Follow Up Instructions:   I discussed the assessment and treatment plan with the patient. The patient was provided an opportunity to ask questions and all were answered. The patient agreed with the plan  and demonstrated an understanding of the instructions.   The patient was advised to call back or seek an in-person evaluation if the symptoms worsen or if the condition fails to improve as anticipated.   Total minutes including chart review and phone contact time: 18   Follow up plan: Return if symptoms worsen or fail to improve.  [name], MD [place]

## 2020-06-29 ENCOUNTER — Encounter: Payer: Self-pay | Admitting: Family Medicine

## 2020-07-22 ENCOUNTER — Telehealth: Payer: Self-pay | Admitting: Pharmacist

## 2020-07-22 MED ORDER — DENOSUMAB 60 MG/ML ~~LOC~~ SOSY: 60.0000 mg | PREFILLED_SYRINGE | Freq: Once | SUBCUTANEOUS | 0 refills | Status: AC

## 2020-07-22 NOTE — Telephone Encounter (Addendum)
Call placed to [place] [phone] Regarding [facility]'s [facility]  Will send to [place]

## 2020-07-22 NOTE — Telephone Encounter (Signed)
Can you help with this one

## 2020-07-30 ENCOUNTER — Telehealth: Payer: Self-pay

## 2020-07-30 NOTE — Telephone Encounter (Signed)
Received call from [place] and pharmacist stated that patient must try oral medication first and wants to know what we would like to do. Please advise

## 2020-07-30 NOTE — Telephone Encounter (Signed)
Again, never have met this patient. I'd be glad to see her to discuss options.  Please schedule her.

## 2020-07-30 NOTE — Telephone Encounter (Signed)
Left message informing patient that she needed to call the office to schedule an appointment with Dr. Lajuana Ripple.

## 2020-07-30 NOTE — Telephone Encounter (Signed)
Patient has not tried and failed PO therapy for osteoporosis Originally, I thought I was just helping with a PA for Prolia Appears patient must try PO/fail 1st I'm happy to discuss with patient PO options,etc  Let me know how you would like to proceed.  I think this patient was new to you too.  Last DEXA results were on 3.10.21

## 2020-08-02 ENCOUNTER — Encounter: Payer: Self-pay | Admitting: Family Medicine

## 2020-08-02 ENCOUNTER — Ambulatory Visit (INDEPENDENT_AMBULATORY_CARE_PROVIDER_SITE_OTHER): Payer: 59 | Admitting: Family Medicine

## 2020-08-02 ENCOUNTER — Other Ambulatory Visit: Payer: Self-pay

## 2020-08-02 VITALS — BP 110/61 | HR 74 | Temp 98.0°F | Ht 63.0 in | Wt 144.2 lb

## 2020-08-02 DIAGNOSIS — M810 Age-related osteoporosis without current pathological fracture: Secondary | ICD-10-CM

## 2020-08-02 MED ORDER — IBANDRONATE SODIUM 150 MG PO TABS: 150.0000 mg | ORAL_TABLET | ORAL | 0 refills | Status: DC

## 2020-08-02 NOTE — Progress Notes (Signed)
Subjective: CC: Osteoporosis PCP: Janora Norlander, DO FYB:[id number] D Clune is a 59 y.o. female presenting to clinic today for:  1.  Osteoporosis Patient recently diagnosed with osteoporosis in the right hip.  Dr. Livia Snellen attempted to get Prolia injection for patient but apparently her insurance will not cover without trialing one of the bisphosphonates.  We discussed the various options and she would like to proceed with Boniva.  No apparent contraindications.  She exercises regularly, including weightbearing and aerobic.  She gets vitamin D and calcium through both supplementation and adequate dietary sources.  No known family history of osteoporosis.  No known renal disease or thyroid disease.   ROS: Per HPI  No Known Allergies Past Medical History:  Diagnosis Date   Hyperlipidemia    Migraines     Current Outpatient Medications:    fexofenadine-pseudoephedrine (ALLEGRA-D 24) 180-240 MG 24 hr tablet, Take 1 tablet by mouth every evening. For allergy and congestion, Disp: 30 tablet, Rfl: 11   Omega 3 1000 MG CAPS, Take by mouth., Disp: , Rfl:    SUMAtriptan (IMITREX) 100 MG tablet, Take 1 tablet (100 mg total) by mouth every 2 (two) hours as needed for migraine. Do not exceed 2 tabs per 24 hours, Disp: 9 tablet, Rfl: 1 Social History   Socioeconomic History   Marital status: Married    Spouse name: Not on file   Number of children: Not on file   Years of education: Not on file   Highest education level: Not on file  Occupational History   Not on file  Tobacco Use   Smoking status: Former Smoker    Packs/day: 0.50    Types: Cigarettes    Start date: 12/18/1976    Quit date: 12/18/1986    Years since quitting: 33.6   Smokeless tobacco: Never Used  Substance and Sexual Activity   Alcohol use: Yes    Alcohol/week: 2.0 standard drinks    Types: 2 Glasses of wine per week   Drug use: No   Sexual activity: Not on file  Other Topics Concern   Not on file   Social History Narrative   Not on file   Social Determinants of Health   Financial Resource Strain:    Difficulty of Paying Living Expenses:   Food Insecurity:    Worried About Charity fundraiser in the Last Year:    Arboriculturist in the Last Year:   Transportation Needs:    Film/video editor (Medical):    Lack of Transportation (Non-Medical):   Physical Activity:    Days of Exercise per Week:    Minutes of Exercise per Session:   Stress:    Feeling of Stress :   Social Connections:    Frequency of Communication with Friends and Family:    Frequency of Social Gatherings with Friends and Family:    Attends Religious Services:    Active Member of Clubs or Organizations:    Attends Music therapist:    Marital Status:   Intimate Partner Violence:    Fear of Current or Ex-Partner:    Emotionally Abused:    Physically Abused:    Sexually Abused:    Family History  Problem Relation Age of Onset   Pneumonia Mother    Hyperlipidemia Paternal Grandmother        diet controlled   Migraines Neg Hx     Objective: Office vital signs reviewed. BP 110/61    Pulse 74  Temp 98 F (36.7 C)    Ht 5\' 3"  (1.6 m)    Wt 144 lb 3.2 oz (65.4 kg)    SpO2 99%    BMI 25.54 kg/m   Physical Examination:  General: Awake, alert, well nourished, well appearing female. No acute distress MSK: Normal gait and station; normal tone  Assessment/ Plan: 59 y.o. female   1. Age-related osteoporosis without current pathological fracture She is maximizing calcium, vitamin D and strength bearing exercises.  We discussed various options and we will trial Boniva.  Discussed potential red flags including osteonecrosis of the jaw.  I would like to see her back in about 4 weeks to see how she did with the first dose.  If she is tolerating well we will continue to renew.  If not, we will plan to try pursue Prolia again - ibandronate (BONIVA) 150 MG tablet; Take 1  tablet (150 mg total) by mouth every 30 (thirty) days. Take in the morning with a full glass of water, on an empty stomach, and do not take anything else by mouth or lie down for the next 30 min.  Dispense: 3 tablet; Refill: 0   No orders of the defined types were placed in this encounter.  No orders of the defined types were placed in this encounter.    Janora Norlander, DO [place] [phone]

## 2020-08-02 NOTE — Patient Instructions (Signed)
Ibandronate tablets What is this medicine? IBANDRONATE (i BAN droh nate) slows calcium loss from bones. It is used to treat osteoporosis in women past the age of menopause. This medicine may be used for other purposes; ask your health care provider or pharmacist if you have questions. COMMON BRAND NAME(S): Boniva What should I tell my health care provider before I take this medicine? They need to know if you have any of these conditions:  dental disease  esophagus, stomach, or intestine problems, like acid reflux or GERD  kidney disease  low blood calcium  low vitamin D  problems sitting or standing for 60 minutes  trouble swallowing  an unusual or allergic reaction to ibandronate, other medicines, foods, dyes, or preservatives  pregnant or trying to get pregnant  breast-feeding How should I use this medicine? You must take this medicine exactly as directed or you will lower the amount of medicine you absorb into your body or you may cause yourself harm. Take your dose by mouth first thing in the morning, after you are up for the day. Do not eat or drink anything before you take this medicine. Swallow the tablet with a full glass (6 to 8 ounces) of plain water. Do not take this medicine with any other drink. Do not chew or crush the tablet. After taking this medicine, do not eat breakfast, drink, or take any other medicines or vitamins for at least 1 hour. Stand or sit up for at least 1 hour after taking this medicine; do not lie down. Do not take your medicine more often than directed. Talk to your pediatrician regarding the use of this medicine in children. Special care may be needed. Overdosage: If you think you have taken too much of this medicine contact a poison control center or emergency room at once. NOTE: This medicine is only for you. Do not share this medicine with others. What if I miss a dose? If you miss a dose, do not take it later in the day. Continue your normal  schedule starting the next morning. Do not take double or extra doses. What may interact with this medicine?  aluminum hydroxide  antacids  aspirin  calcium supplements  drugs for inflammation like ibuprofen, naproxen, and others  iron supplements  magnesium supplements  vitamins with minerals This list may not describe all possible interactions. Give your health care provider a list of all the medicines, herbs, non-prescription drugs, or dietary supplements you use. Also tell them if you smoke, drink alcohol, or use illegal drugs. Some items may interact with your medicine. What should I watch for while using this medicine? Visit your doctor or health care professional for regular check ups. It may be some time before you see the benefit from this medicine. Do not stop taking your medicine except on your doctor's advice. Your doctor or health care professional may order blood tests and other tests to see how you are doing. You should make sure you get enough calcium and vitamin D while you are taking this medicine, unless your doctor tells you not to. Discuss the foods you eat and the vitamins you take with your health care professional. Some people who take this medicine have severe bone, joint, and/or muscle pain. This medicine may also increase your risk for a broken thigh bone. Tell your doctor right away if you have pain in your upper leg or groin. Tell your doctor if you have any pain that does not go away or that gets  worse. What side effects may I notice from receiving this medicine? Side effects that you should report to your doctor or health care professional as soon as possible:  allergic reactions such as skin rash or itching, hives, swelling of the face, lips, throat, or tongue  black or tarry stools  bone, joint, or muscle pain  changes in vision  chest pain  heartburn or stomach pain  jaw pain, especially after dental work  pain or trouble when swallowing Side  effects that usually do not require medical attention (report to your doctor or health care professional if they continue or are bothersome):  diarrhea or constipation  eye pain or itching  headache  nausea or vomiting  trouble sleeping This list may not describe all possible side effects. Call your doctor for medical advice about side effects. You may report side effects to FDA at 1-800-FDA-1088. Where should I keep my medicine? Keep out of the reach of children. Store at room temperature between 15 and 30 degrees C (59 and 86 degrees F). Throw away any unused medicine after the expiration date. NOTE: This sheet is a summary. It may not cover all possible information. If you have questions about this medicine, talk to your doctor, pharmacist, or health care provider.  2020 [employer] Standard (2011-06-02 09:03:41)

## 2020-10-11 ENCOUNTER — Other Ambulatory Visit: Payer: Self-pay | Admitting: Family Medicine

## 2020-10-11 DIAGNOSIS — M810 Age-related osteoporosis without current pathological fracture: Secondary | ICD-10-CM

## 2020-12-15 ENCOUNTER — Other Ambulatory Visit: Payer: Self-pay

## 2020-12-15 ENCOUNTER — Encounter: Payer: Self-pay | Admitting: Family Medicine

## 2020-12-15 ENCOUNTER — Ambulatory Visit (INDEPENDENT_AMBULATORY_CARE_PROVIDER_SITE_OTHER): Payer: 59 | Admitting: Family Medicine

## 2020-12-15 VITALS — BP 119/67 | HR 67 | Temp 98.1°F | Ht 63.0 in | Wt 144.4 lb

## 2020-12-15 DIAGNOSIS — R21 Rash and other nonspecific skin eruption: Secondary | ICD-10-CM

## 2020-12-15 DIAGNOSIS — M545 Low back pain, unspecified: Secondary | ICD-10-CM | POA: Diagnosis not present

## 2020-12-15 MED ORDER — PREDNISONE 20 MG PO TABS: ORAL_TABLET | ORAL | 0 refills | Status: DC

## 2020-12-15 MED ORDER — NYSTATIN 100000 UNIT/GM EX CREA: 1.0000 "application " | TOPICAL_CREAM | Freq: Two times a day (BID) | CUTANEOUS | 0 refills | Status: DC

## 2020-12-15 MED ORDER — METHYLPREDNISOLONE ACETATE 80 MG/ML IJ SUSP
80.0000 mg | Freq: Once | INTRAMUSCULAR | Status: AC
  Administered 2020-12-15: 80 mg via INTRAMUSCULAR

## 2020-12-15 NOTE — Progress Notes (Signed)
Subjective: CC: rash, back pain PCP: Janora Norlander, DO  Carla Moreno is a 59 y.o. female presenting to clinic today for:  1. Rash Rash on left thigh/groin for 2 months. Carla Moreno has tried cortisone cream and eczema cream. Neither of these has helped.  It is red and itchy and is in a circular shape. It has gotten larger. It seems to be worse when Carla Moreno is hot and sweaty. Denies fever, drainage, or pain.   2. Back pain Carla Moreno reports back pain for about 2 weeks. This started the day after getting her booster shot. It is worse on the left side. It does radiate around to her hip at times. It is worse with bending and has gotten worse since restarting her exercise routine. Advil has helped some. Pain is 8/10, Carla Moreno admits Carla Moreno has a low pain tolerance. It is a constant dull pain with intermittent sharp pain with movement. Carla Moreno denies injury, changes in bowel or bladder control, numbness or tingling. Denies fever or urinary symptoms.   Relevant past medical, surgical, family, and social history reviewed and updated as indicated.  Allergies and medications reviewed and updated.  No Known Allergies Past Medical History:  Diagnosis Date  . Hyperlipidemia   . Migraines     Current Outpatient Medications:  .  Ascorbic Acid (VITAMIN C PO), Take by mouth., Disp: , Rfl:  .  fexofenadine-pseudoephedrine (ALLEGRA-D 24) 180-240 MG 24 hr tablet, Take 1 tablet by mouth every evening. For allergy and congestion, Disp: 30 tablet, Rfl: 11 .  ibandronate (BONIVA) 150 MG tablet, TAKE 1 TABLET IN THE MORNING WITH A FULL GLASS OF WATER ON AN EMPTY STOMACH ONCE EVERY 30 DAYS-DO NOT LIE DOWN FOR 30 MIN, Disp: 12 tablet, Rfl: 1 .  Multiple Vitamins-Minerals (ZINC PO), Take by mouth., Disp: , Rfl:  .  Omega 3 1000 MG CAPS, Take by mouth., Disp: , Rfl:  .  Red Yeast Rice Extract (RED YEAST RICE PO), Take by mouth., Disp: , Rfl:  .  SUMAtriptan (IMITREX) 100 MG tablet, Take 1 tablet (100 mg total) by mouth  every 2 (two) hours as needed for migraine. Do not exceed 2 tabs per 24 hours, Disp: 9 tablet, Rfl: 1 .  VITAMIN D PO, Take by mouth., Disp: , Rfl:  Social History   Socioeconomic History  . Marital status: Married    Spouse name: Not on file  . Number of children: Not on file  . Years of education: Not on file  . Highest education level: Not on file  Occupational History  . Not on file  Tobacco Use  . Smoking status: Former Smoker    Packs/day: 0.50    Types: Cigarettes    Start date: 12/18/1976    Quit date: 12/18/1986    Years since quitting: 34.0  . Smokeless tobacco: Never Used  Substance and Sexual Activity  . Alcohol use: Yes    Alcohol/week: 2.0 standard drinks    Types: 2 Glasses of wine per week  . Drug use: No  . Sexual activity: Not on file  Other Topics Concern  . Not on file  Social History Narrative  . Not on file   Social Determinants of Health   Financial Resource Strain: Not on file  Food Insecurity: Not on file  Transportation Needs: Not on file  Physical Activity: Not on file  Stress: Not on file  Social Connections: Not on file  Intimate Partner Violence: Not on file   Family History  Problem Relation Age of Onset  . Pneumonia Mother   . Hyperlipidemia Paternal Grandmother        diet controlled  . Migraines Neg Hx     Review of Systems  Negative unless specially indicated above in HPI.  Objective: Office vital signs reviewed. BP 119/67   Pulse 67   Temp 98.1 F (36.7 C) (Temporal)   Ht 5' 3"  (1.6 m)   Wt 144 lb 6 oz (65.5 kg)   BMI 25.57 kg/m   Physical Examination:  Physical Exam Vitals and nursing note reviewed.  Constitutional:      General: Carla Moreno is not in acute distress.    Appearance: Normal appearance. Carla Moreno is not ill-appearing, toxic-appearing or diaphoretic.  Cardiovascular:     Rate and Rhythm: Normal rate and regular rhythm.     Heart sounds: Normal heart sounds. No murmur heard.   Pulmonary:     Effort: No  respiratory distress.     Breath sounds: Normal breath sounds.  Abdominal:     General: There is no distension.     Tenderness: There is no abdominal tenderness. There is no right CVA tenderness or left CVA tenderness.  Musculoskeletal:     Cervical back: Normal.     Thoracic back: Normal.     Lumbar back: No swelling, edema, signs of trauma or bony tenderness. Normal range of motion. Negative right straight leg raise test and negative left straight leg raise test.       Back:  Skin:      Neurological:     General: No focal deficit present.     Mental Status: Carla Moreno is alert and oriented to person, place, and time.  Psychiatric:        Mood and Affect: Mood normal.        Behavior: Behavior normal.      Results for orders placed or performed in visit on 02/13/20  CBC with Differential/Platelet  Result Value Ref Range   WBC 5.8 3.4 - 10.8 x10E3/uL   RBC 4.12 3.77 - 5.28 x10E6/uL   Hemoglobin 12.8 11.1 - 15.9 g/dL   Hematocrit 37.5 34.0 - 46.6 %   MCV 91 79 - 97 fL   MCH 31.1 26.6 - 33.0 pg   MCHC 34.1 31.5 - 35.7 g/dL   RDW 13.8 11.7 - 15.4 %   Platelets 246 150 - 450 x10E3/uL   Neutrophils 55 Not Estab. %   Lymphs 32 Not Estab. %   Monocytes 8 Not Estab. %   Eos 4 Not Estab. %   Basos 1 Not Estab. %   Neutrophils Absolute 3.2 1.4 - 7.0 x10E3/uL   Lymphocytes Absolute 1.9 0.7 - 3.1 x10E3/uL   Monocytes Absolute 0.4 0.1 - 0.9 x10E3/uL   EOS (ABSOLUTE) 0.2 0.0 - 0.4 x10E3/uL   Basophils Absolute 0.1 0.0 - 0.2 x10E3/uL   Immature Granulocytes 0 Not Estab. %   Immature Grans (Abs) 0.0 0.0 - 0.1 x10E3/uL  CMP14+EGFR  Result Value Ref Range   Glucose 79 65 - 99 mg/dL   BUN 13 6 - 24 mg/dL   Creatinine, Ser 0.68 0.57 - 1.00 mg/dL   GFR calc non Af Amer 96 >59 mL/min/1.73   GFR calc Af Amer 111 >59 mL/min/1.73   BUN/Creatinine Ratio 19 9 - 23   Sodium 145 (H) 134 - 144 mmol/L   Potassium 4.1 3.5 - 5.2 mmol/L   Chloride 106 96 - 106 mmol/L   CO2 24 20 - 29 mmol/L  Calcium 9.5 8.7 - 10.2 mg/dL   Total Protein 7.0 6.0 - 8.5 g/dL   Albumin 4.7 3.8 - 4.9 g/dL   Globulin, Total 2.3 1.5 - 4.5 g/dL   Albumin/Globulin Ratio 2.0 1.2 - 2.2   Bilirubin Total 1.0 0.0 - 1.2 mg/dL   Alkaline Phosphatase 64 39 - 117 IU/L   AST 35 0 - 40 IU/L   ALT 22 0 - 32 IU/L  Lipid panel  Result Value Ref Range   Cholesterol, Total 216 (H) 100 - 199 mg/dL   Triglycerides 75 0 - 149 mg/dL   HDL 52 >39 mg/dL   VLDL Cholesterol Cal 13 5 - 40 mg/dL   LDL Chol Calc (NIH) 151 (H) 0 - 99 mg/dL   Chol/HDL Ratio 4.2 0.0 - 4.4 ratio  Thyroid Panel With TSH  Result Value Ref Range   TSH 1.700 0.450 - 4.500 uIU/mL   T4, Total 7.9 4.5 - 12.0 ug/dL   T3 Uptake Ratio 26 24 - 39 %   Free Thyroxine Index 2.1 1.2 - 4.9  VITAMIN D 25 Hydroxy (Vit-D Deficiency, Fractures)  Result Value Ref Range   Vit D, 25-Hydroxy 39.5 30.0 - 100.0 ng/mL     Assessment/ Plan: Carla Moreno was seen today for rash and back pain.  Diagnoses and all orders for this visit:  Acute left-sided low back pain without sciatica Declined PT today. Continue Advil. Steroid IM injection today in office. Prednisone burst tomorrow. Heat, ice as needed. Carla Moreno will let me know how Carla Moreno is doing in a few weeks. Handout with back exercises given. Return to office for new or worsening symptoms, or if symptoms persist.  -     predniSONE (DELTASONE) 20 MG tablet; 2 po at sametime daily for 5 days- start tomorrow -     methylPREDNISolone acetate (DEPO-MEDROL) injection 80 mg  Rash ? Fungal. Nystatin cream ordered. Return to office for new or worsening symptoms, or if symptoms persist.  -     nystatin cream (MYCOSTATIN); Apply 1 application topically 2 (two) times daily.   Follow up as needed.   The above assessment and management plan was discussed with the patient. The patient verbalized understanding of and has agreed to the management plan. Patient is aware to call the clinic if symptoms persist or worsen. Patient is aware  when to return to the clinic for a follow-up visit. Patient educated on when it is appropriate to go to the emergency department.   Marjorie Smolder, FNP-C [facility] [address] [place], [place] [address] [phone]

## 2020-12-15 NOTE — Patient Instructions (Signed)
Sacroiliac Joint Dysfunction  Sacroiliac joint dysfunction is a condition that causes inflammation on one or both sides of the sacroiliac (SI) joint. The SI joint connects the lower part of the spine (sacrum) with the two upper portions of the pelvis (ilium). This condition causes deep aching or burning pain in the low back. In some cases, the pain may also spread into one or both buttocks, hips, or thighs. What are the causes? This condition may be caused by:  Pregnancy. During pregnancy, extra stress is put on the SI joints because the pelvis widens.  Injury, such as: ? Injuries from car accidents. ? Sports-related injuries. ? Work-related injuries.  Having one leg that is shorter than the other.  Conditions that affect the joints, such as: ? Rheumatoid arthritis. ? Gout. ? Psoriatic arthritis. ? Joint infection (septic arthritis). Sometimes, the cause of SI joint dysfunction is not known. What are the signs or symptoms? Symptoms of this condition include:  Aching or burning pain in the lower back. The pain may also spread to other areas, such as: ? Buttocks. ? Groin. ? Thighs.  Muscle spasms in or around the painful areas.  Increased pain when standing, walking, running, stair climbing, bending, or lifting. How is this diagnosed? This condition is diagnosed with a physical exam and medical history. During the exam, the health care provider may move one or both of your legs to different positions to check for pain. Various tests may be done to confirm the diagnosis, including:  Imaging tests to look for other causes of pain. These may include: ? MRI. ? CT scan. ? Bone scan.  Diagnostic injection. A numbing medicine is injected into the SI joint using a needle. If your pain is temporarily improved or stopped after the injection, this can indicate that SI joint dysfunction is the problem. How is this treated? Treatment depends on the cause and severity of your condition.  Treatment options may include:  Ice or heat applied to the lower back area after an injury. This may help reduce pain and muscle spasms.  Medicines to relieve pain or inflammation or to relax the muscles.  Wearing a back brace (sacroiliac brace) to help support the joint while your back is healing.  Physical therapy to increase muscle strength around the joint and flexibility at the joint. This may also involve learning proper body positions and ways of moving to relieve stress on the joint.  Direct manipulation of the SI joint.  Injections of steroid medicine into the joint to reduce pain and swelling.  Radiofrequency ablation to burn away nerves that are carrying pain messages from the joint.  Use of a device that provides electrical stimulation to help reduce pain at the joint.  Surgery to put in screws and plates that limit or prevent joint motion. This is rare. Follow these instructions at home: Medicines  Take over-the-counter and prescription medicines only as told by your health care provider.  Do not drive or use heavy machinery while taking prescription pain medicine.  If you are taking prescription pain medicine, take actions to prevent or treat constipation. Your health care provider may recommend that you: ? Drink enough fluid to keep your urine pale yellow. ? Eat foods that are high in fiber, such as fresh fruits and vegetables, whole grains, and beans. ? Limit foods that are high in fat and processed sugars, such as fried or sweet foods. ? Take an over-the-counter or prescription medicine for constipation. If you have a brace:    Wear the brace as told by your health care provider. Remove it only as told by your health care provider.  Keep the brace clean.  If the brace is not waterproof: ? Do not let it get wet. ? Cover it with a watertight covering when you take a bath or a shower. Managing pain, stiffness, and swelling   Low Back Sprain or Strain Rehab Ask  your health care provider which exercises are safe for you. Do exercises exactly as told by your health care provider and adjust them as directed. It is normal to feel mild stretching, pulling, tightness, or discomfort as you do these exercises. Stop right away if you feel sudden pain or your pain gets worse. Do not begin these exercises until told by your health care provider. Stretching and range-of-motion exercises These exercises warm up your muscles and joints and improve the movement and flexibility of your back. These exercises also help to relieve pain, numbness, and tingling. Lumbar rotation  Lie on your back on a firm surface and bend your knees. Straighten your arms out to your sides so each arm forms a 90-degree angle (right angle) with a side of your body. Slowly move (rotate) both of your knees to one side of your body until you feel a stretch in your lower back (lumbar). Try not to let your shoulders lift off the floor. Hold this position for __________ seconds. Tense your abdominal muscles and slowly move your knees back to the starting position. Repeat this exercise on the other side of your body. Repeat __________ times. Complete this exercise __________ times a day. Single knee to chest  Lie on your back on a firm surface with both legs straight. Bend one of your knees. Use your hands to move your knee up toward your chest until you feel a gentle stretch in your lower back and buttock. Hold your leg in this position by holding on to the front of your knee. Keep your other leg as straight as possible. Hold this position for __________ seconds. Slowly return to the starting position. Repeat with your other leg. Repeat __________ times. Complete this exercise __________ times a day. Prone extension on elbows  Lie on your abdomen on a firm surface (prone position). Prop yourself up on your elbows. Use your arms to help lift your chest up until you feel a gentle stretch in  your abdomen and your lower back. This will place some of your body weight on your elbows. If this is uncomfortable, try stacking pillows under your chest. Your hips should stay down, against the surface that you are lying on. Keep your hip and back muscles relaxed. Hold this position for __________ seconds. Slowly relax your upper body and return to the starting position. Repeat __________ times. Complete this exercise __________ times a day. Strengthening exercises These exercises build strength and endurance in your back. Endurance is the ability to use your muscles for a long time, even after they get tired. Pelvic tilt This exercise strengthens the muscles that lie deep in the abdomen. Lie on your back on a firm surface. Bend your knees and keep your feet flat on the floor. Tense your abdominal muscles. Tip your pelvis up toward the ceiling and flatten your lower back into the floor. To help with this exercise, you may place a small towel under your lower back and try to push your back into the towel. Hold this position for __________ seconds. Let your muscles relax completely before you repeat  this exercise. Repeat __________ times. Complete this exercise __________ times a day. Alternating arm and leg raises  Get on your hands and knees on a firm surface. If you are on a hard floor, you may want to use padding, such as an exercise mat, to cushion your knees. Line up your arms and legs. Your hands should be directly below your shoulders, and your knees should be directly below your hips. Lift your left leg behind you. At the same time, raise your right arm and straighten it in front of you. Do not lift your leg higher than your hip. Do not lift your arm higher than your shoulder. Keep your abdominal and back muscles tight. Keep your hips facing the ground. Do not arch your back. Keep your balance carefully, and do not hold your breath. Hold this position for __________  seconds. Slowly return to the starting position. Repeat with your right leg and your left arm. Repeat __________ times. Complete this exercise __________ times a day. Abdominal set with straight leg raise  Lie on your back on a firm surface. Bend one of your knees and keep your other leg straight. Tense your abdominal muscles and lift your straight leg up, 4-6 inches (10-15 cm) off the ground. Keep your abdominal muscles tight and hold this position for __________ seconds. Do not hold your breath. Do not arch your back. Keep it flat against the ground. Keep your abdominal muscles tense as you slowly lower your leg back to the starting position. Repeat with your other leg. Repeat __________ times. Complete this exercise __________ times a day. Single leg lower with bent knees Lie on your back on a firm surface. Tense your abdominal muscles and lift your feet off the floor, one foot at a time, so your knees and hips are bent in 90-degree angles (right angles). Your knees should be over your hips and your lower legs should be parallel to the floor. Keeping your abdominal muscles tense and your knee bent, slowly lower one of your legs so your toe touches the ground. Lift your leg back up to return to the starting position. Do not hold your breath. Do not let your back arch. Keep your back flat against the ground. Repeat with your other leg. Repeat __________ times. Complete this exercise __________ times a day. Posture and body mechanics Good posture and healthy body mechanics can help to relieve stress in your body's tissues and joints. Body mechanics refers to the movements and positions of your body while you do your daily activities. Posture is part of body mechanics. Good posture means: Your spine is in its natural S-curve position (neutral). Your shoulders are pulled back slightly. Your head is not tipped forward. Follow these guidelines to improve your posture and body mechanics in  your everyday activities. Standing  When standing, keep your spine neutral and your feet about hip width apart. Keep a slight bend in your knees. Your ears, shoulders, and hips should line up. When you do a task in which you stand in one place for a long time, place one foot up on a stable object that is 2-4 inches (5-10 cm) high, such as a footstool. This helps keep your spine neutral. Sitting  When sitting, keep your spine neutral and keep your feet flat on the floor. Use a footrest, if necessary, and keep your thighs parallel to the floor. Avoid rounding your shoulders, and avoid tilting your head forward. When working at a desk or a Teaching laboratory technician, keep  your desk at a height where your hands are slightly lower than your elbows. Slide your chair under your desk so you are close enough to maintain good posture. When working at a computer, place your monitor at a height where you are looking straight ahead and you do not have to tilt your head forward or downward to look at the screen. Resting When lying down and resting, avoid positions that are most painful for you. If you have pain with activities such as sitting, bending, stooping, or squatting, lie in a position in which your body does not bend very much. For example, avoid curling up on your side with your arms and knees near your chest (fetal position). If you have pain with activities such as standing for a long time or reaching with your arms, lie with your spine in a neutral position and bend your knees slightly. Try the following positions: Lying on your side with a pillow between your knees. Lying on your back with a pillow under your knees. Lifting  When lifting objects, keep your feet at least shoulder width apart and tighten your abdominal muscles. Bend your knees and hips and keep your spine neutral. It is important to lift using the strength of your legs, not your back. Do not lock your knees straight out. Always ask for help to lift  heavy or awkward objects. This information is not intended to replace advice given to you by your health care provider. Make sure you discuss any questions you have with your health care provider. Document Revised: 03/28/2019 Document Reviewed: 12/26/2018 Elsevier Patient Education  2020 [employer].    Icing can help with pain and swelling. Heat may help with muscle tension or spasms. Ask your health care provider if you should use ice or heat.  If directed, put ice on the affected area: ? If you have a removable brace, remove it as told by your health care provider. ? Put ice in a plastic bag. ? Place a towel between your skin and the bag. ? Leave the ice on for 20 minutes, 2-3 times a day.  If directed, apply heat to the affected area. Use the heat source that your health care provider recommends, such as a moist heat pack or a heating pad. ? Place a towel between your skin and the heat source. ? Leave the heat on for 20-30 minutes. ? Remove the heat if your skin turns bright red. This is especially important if you are unable to feel pain, heat, or cold. You may have a greater risk of getting burned. General instructions  Rest as needed. Ask your health care provider what activities are safe for you.  Return to your normal activities as told by your health care provider.  Exercise as directed by your health care provider or physical therapist.  Do not use any products that contain nicotine or tobacco, such as cigarettes and e-cigarettes. These can delay bone healing. If you need help quitting, ask your health care provider.  Keep all follow-up visits as told by your health care provider. This is important. Contact a health care provider if:  Your pain is not controlled with medicine.  You have a fever.  Your pain is getting worse. Get help right away if:  You have weakness, numbness, or tingling in your legs or feet.  You lose control of your bladder or  bowel. Summary  Sacroiliac joint dysfunction is a condition that causes inflammation on one or both sides of  the sacroiliac (SI) joint.  This condition causes deep aching or burning pain in the low back. In some cases, the pain may also spread into one or both buttocks, hips, or thighs.  Treatment depends on the cause and severity of your condition. It may include medicines to reduce pain and swelling or to relax muscles. This information is not intended to replace advice given to you by your health care provider. Make sure you discuss any questions you have with your health care provider. Document Revised: 07/31/2018 Document Reviewed: 01/14/2018 Elsevier Patient Education  2020 [employer].

## 2021-02-18 ENCOUNTER — Encounter: Payer: 59 | Admitting: Family Medicine

## 2021-02-18 ENCOUNTER — Encounter: Payer: Self-pay | Admitting: Family Medicine

## 2021-02-18 ENCOUNTER — Ambulatory Visit (INDEPENDENT_AMBULATORY_CARE_PROVIDER_SITE_OTHER): Payer: 59 | Admitting: Family Medicine

## 2021-02-18 ENCOUNTER — Other Ambulatory Visit: Payer: Self-pay

## 2021-02-18 VITALS — BP 107/67 | HR 69 | Temp 97.9°F | Ht 63.0 in | Wt 145.0 lb

## 2021-02-18 DIAGNOSIS — E7800 Pure hypercholesterolemia, unspecified: Secondary | ICD-10-CM | POA: Diagnosis not present

## 2021-02-18 DIAGNOSIS — E559 Vitamin D deficiency, unspecified: Secondary | ICD-10-CM

## 2021-02-18 DIAGNOSIS — Z0001 Encounter for general adult medical examination with abnormal findings: Secondary | ICD-10-CM | POA: Diagnosis not present

## 2021-02-18 DIAGNOSIS — Z0000 Encounter for general adult medical examination without abnormal findings: Secondary | ICD-10-CM

## 2021-02-18 DIAGNOSIS — B356 Tinea cruris: Secondary | ICD-10-CM

## 2021-02-18 DIAGNOSIS — M810 Age-related osteoporosis without current pathological fracture: Secondary | ICD-10-CM

## 2021-02-18 MED ORDER — GRISEOFULVIN MICROSIZE 500 MG PO TABS: 500.0000 mg | ORAL_TABLET | Freq: Every day | ORAL | 0 refills | Status: DC

## 2021-02-18 NOTE — Patient Instructions (Signed)
You had labs performed today.  You will be contacted with the results of the labs once they are available, usually in the next 3 business days for routine lab work.  If you have an active my chart account, they will be released to your MyChart.  If you prefer to have these labs released to you via telephone, please let us know.  If you had a pap smear or biopsy performed, expect to be contacted in about 7-10 days.  Preventive Care 40-60 Years Old, Female Preventive care refers to lifestyle choices and visits with your health care provider that can promote health and wellness. This includes:  A yearly physical exam. This is also called an annual wellness visit.  Regular dental and eye exams.  Immunizations.  Screening for certain conditions.  Healthy lifestyle choices, such as: ? Eating a healthy diet. ? Getting regular exercise. ? Not using drugs or products that contain nicotine and tobacco. ? Limiting alcohol use. What can I expect for my preventive care visit? Physical exam Your health care provider will check your:  Height and weight. These may be used to calculate your BMI (body mass index). BMI is a measurement that tells if you are at a healthy weight.  Heart rate and blood pressure.  Body temperature.  Skin for abnormal spots. Counseling Your health care provider may ask you questions about your:  Past medical problems.  Family's medical history.  Alcohol, tobacco, and drug use.  Emotional well-being.  Home life and relationship well-being.  Sexual activity.  Diet, exercise, and sleep habits.  Work and work environment.  Access to firearms.  Method of birth control.  Menstrual cycle.  Pregnancy history. What immunizations do I need? Vaccines are usually given at various ages, according to a schedule. Your health care provider will recommend vaccines for you based on your age, medical history, and lifestyle or other factors, such as travel or where you  work.   What tests do I need? Blood tests  Lipid and cholesterol levels. These may be checked every 5 years, or more often if you are over 60 years old.  Hepatitis C test.  Hepatitis B test. Screening  Lung cancer screening. You may have this screening every year starting at age 60 if you have a 30-pack-year history of smoking and currently smoke or have quit within the past 15 years.  Colorectal cancer screening. ? All adults should have this screening starting at age 60 and continuing until age 75. ? Your health care provider may recommend screening at age 60 if you are at increased risk. ? You will have tests every 1-10 years, depending on your results and the type of screening test.  Diabetes screening. ? This is done by checking your blood sugar (glucose) after you have not eaten for a while (fasting). ? You may have this done every 1-3 years.  Mammogram. ? This may be done every 1-2 years. ? Talk with your health care provider about when you should start having regular mammograms. This may depend on whether you have a family history of breast cancer.  BRCA-related cancer screening. This may be done if you have a family history of breast, ovarian, tubal, or peritoneal cancers.  Pelvic exam and Pap test. ? This may be done every 3 years starting at age 21. ? Starting at age 30, this may be done every 5 years if you have a Pap test in combination with an HPV test. Other tests  STD (sexually transmitted   disease) testing, if you are at risk.  Bone density scan. This is done to screen for osteoporosis. You may have this scan if you are at high risk for osteoporosis. Talk with your health care provider about your test results, treatment options, and if necessary, the need for more tests. Follow these instructions at home: Eating and drinking  Eat a diet that includes fresh fruits and vegetables, whole grains, lean protein, and low-fat dairy products.  Take vitamin and mineral  supplements as recommended by your health care provider.  Do not drink alcohol if: ? Your health care provider tells you not to drink. ? You are pregnant, may be pregnant, or are planning to become pregnant.  If you drink alcohol: ? Limit how much you have to 0-1 drink a day. ? Be aware of how much alcohol is in your drink. In the [place] one drink equals one 12 oz bottle of beer (355 mL), one 5 oz glass of wine (148 mL), or one 1 oz glass of hard liquor (44 mL).   Lifestyle  Take daily care of your teeth and gums. Brush your teeth every morning and night with fluoride toothpaste. Floss one time each day.  Stay active. Exercise for at least 30 minutes 5 or more days each week.  Do not use any products that contain nicotine or tobacco, such as cigarettes, e-cigarettes, and chewing tobacco. If you need help quitting, ask your health care provider.  Do not use drugs.  If you are sexually active, practice safe sex. Use a condom or other form of protection to prevent STIs (sexually transmitted infections).  If you do not wish to become pregnant, use a form of birth control. If you plan to become pregnant, see your health care provider for a prepregnancy visit.  If told by your health care provider, take low-dose aspirin daily starting at age 79.  Find healthy ways to cope with stress, such as: ? Meditation, yoga, or listening to music. ? Journaling. ? Talking to a trusted person. ? Spending time with friends and family. Safety  Always wear your seat belt while driving or riding in a vehicle.  Do not drive: ? If you have been drinking alcohol. Do not ride with someone who has been drinking. ? When you are tired or distracted. ? While texting.  Wear a helmet and other protective equipment during sports activities.  If you have firearms in your house, make sure you follow all gun safety procedures. What's next?  Visit your health care provider once a year for an annual wellness  visit.  Ask your health care provider how often you should have your eyes and teeth checked.  Stay up to date on all vaccines. This information is not intended to replace advice given to you by your health care provider. Make sure you discuss any questions you have with your health care provider. Document Revised: 09/07/2020 Document Reviewed: 08/15/2018 Elsevier Patient Education  2021 [employer].

## 2021-02-18 NOTE — Progress Notes (Signed)
Carla Moreno is a 60 y.o. female presents to office today for annual physical exam examination.    Concerns today include: 1.  Groin rash Groin rash has been present since December.  She was placed on nystatin cream and she has this helped a little bit but the symptoms have not resolved yet.  She tries to keep area dry but does often sweat with workouts.  No exudates.  Occupation: Works at [employer], Marital status: Married, Substance use: None Diet: Balanced, Exercise: Structured Last eye exam: Up-to-date Last dental exam: Up-to-date Last colonoscopy: Due soon Last mammogram: Up-to-date Last pap smear: Status post hysterectomy Refills needed today: None Immunizations needed:  Immunization History  Administered Date(s) Administered  . Influenza Inj Mdck Quad With Preservative 09/29/2019  . Influenza Split 09/22/2013  . Influenza,inj,Quad PF,6+ Mos 10/06/2014, 09/27/2015, 09/18/2016, 10/01/2017, 10/01/2018  . Influenza-Unspecified 10/04/2020  . Moderna Sars-Covid-2 Vaccination 02/20/2020, 03/24/2020, 12/02/2020  . Tdap 04/29/2014, 02/10/2018     Past Medical History:  Diagnosis Date  . Hyperlipidemia   . Migraines    Social History   Socioeconomic History  . Marital status: Married    Spouse name: Not on file  . Number of children: Not on file  . Years of education: Not on file  . Highest education level: Not on file  Occupational History  . Not on file  Tobacco Use  . Smoking status: Former Smoker    Packs/day: 0.50    Types: Cigarettes    Start date: 12/18/1976    Quit date: 12/18/1986    Years since quitting: 34.1  . Smokeless tobacco: Never Used  Substance and Sexual Activity  . Alcohol use: Yes    Alcohol/week: 2.0 standard drinks    Types: 2 Glasses of wine per week  . Drug use: No  . Sexual activity: Not on file  Other Topics Concern  . Not on file  Social History Narrative  . Not on file   Social Determinants of Health   Financial Resource  Strain: Not on file  Food Insecurity: Not on file  Transportation Needs: Not on file  Physical Activity: Not on file  Stress: Not on file  Social Connections: Not on file  Intimate Partner Violence: Not on file   Past Surgical History:  Procedure Laterality Date  . ABDOMINAL HYSTERECTOMY  2001  . CESAREAN SECTION     2 occasions   Family History  Problem Relation Age of Onset  . Pneumonia Mother   . Hyperlipidemia Paternal Grandmother        diet controlled  . Migraines Neg Hx     Current Outpatient Medications:  .  Ascorbic Acid (VITAMIN C PO), Take by mouth., Disp: , Rfl:  .  fexofenadine-pseudoephedrine (ALLEGRA-D 24) 180-240 MG 24 hr tablet, Take 1 tablet by mouth every evening. For allergy and congestion, Disp: 30 tablet, Rfl: 11 .  ibandronate (BONIVA) 150 MG tablet, TAKE 1 TABLET IN THE MORNING WITH A FULL GLASS OF WATER ON AN EMPTY STOMACH ONCE EVERY 30 DAYS-DO NOT LIE DOWN FOR 30 MIN, Disp: 12 tablet, Rfl: 1 .  Multiple Vitamins-Minerals (ZINC PO), Take by mouth., Disp: , Rfl:  .  nystatin cream (MYCOSTATIN), Apply 1 application topically 2 (two) times daily., Disp: 30 g, Rfl: 0 .  Omega 3 1000 MG CAPS, Take by mouth., Disp: , Rfl:  .  Red Yeast Rice Extract (RED YEAST RICE PO), Take by mouth., Disp: , Rfl:  .  SUMAtriptan (IMITREX) 100 MG tablet,  Take 1 tablet (100 mg total) by mouth every 2 (two) hours as needed for migraine. Do not exceed 2 tabs per 24 hours, Disp: 9 tablet, Rfl: 1 .  VITAMIN D PO, Take by mouth., Disp: , Rfl:   No Known Allergies   ROS: Review of Systems Pertinent items noted in HPI and remainder of comprehensive ROS otherwise negative.    Physical exam BP 107/67   Pulse 69   Temp 97.9 F (36.6 C) (Temporal)   Ht 5' 3"  (1.6 m)   Wt 145 lb (65.8 kg)   BMI 25.69 kg/m  General appearance: alert, cooperative, appears stated age and no distress Head: Normocephalic, without obvious abnormality, atraumatic Eyes: conjunctivae/corneas clear.  PERRL, EOM's intact. Fundi benign. Ears: normal TM's and external ear canals both ears Nose: Nares normal. Septum midline. Mucosa normal. No drainage or sinus tenderness. Throat: lips, mucosa, and tongue normal; teeth and gums normal Neck: no adenopathy, supple, symmetrical, trachea midline and thyroid not enlarged, symmetric, no tenderness/mass/nodules Back: symmetric, no curvature. ROM normal. No CVA tenderness. Lungs: clear to auscultation bilaterally Heart: regular rate and rhythm, S1, S2 normal, no murmur, click, rub or gallop Abdomen: soft, non-tender; bowel sounds normal; no masses,  no organomegaly Extremities: extremities normal, atraumatic, no cyanosis or edema Pulses: 2+ and symmetric Skin: Irregularly shaped, slightly scaly skin lesion with raised borders and central clearing noted along the left groin and upper thigh region.  No exudates.  No maceration. Lymph nodes: Cervical, supraclavicular, and axillary nodes normal. Neurologic: Alert and oriented X 3, normal strength and tone. Normal symmetric reflexes. Normal coordination and gait Psych: Pleasant, interactive.  Mood is stable Depression screen [facility] 2/9 02/18/2021 08/02/2020 02/13/2020  Decreased Interest 0 0 0  Down, Depressed, Hopeless 0 0 0  PHQ - 2 Score 0 0 0  Altered sleeping 0 - -  Tired, decreased energy 0 - -  Change in appetite 0 - -  Feeling bad or failure about yourself  0 - -  Trouble concentrating 0 - -  Moving slowly or fidgety/restless 0 - -  Suicidal thoughts 0 - -  PHQ-9 Score 0 - -    Assessment/ Plan: [name] here for annual physical exam.   Annual physical exam  Age-related osteoporosis without current pathological fracture - Plan: CMP14+EGFR, CBC, VITAMIN D 25 Hydroxy (Vit-D Deficiency, Fractures)  Vitamin D deficiency - Plan: VITAMIN D 25 Hydroxy (Vit-D Deficiency, Fractures)  Pure hypercholesterolemia - Plan: CMP14+EGFR, TSH, Lipid Panel  Tinea cruris - Plan: griseofulvin  (GRIFULVIN V) 500 MG tablet  Doing well.  Check CMP, vitamin D and CBC given osteoporosis.  Reinforced need for vitamin D and calcium.  Continue Boniva.  No red flags  Check fasting lipid.  She is maintaining an extremely well-balanced diet and exercising.  Lesion is consistent with tinea cruris that is now refractory to topical antifungals.  Griseofulvin prescribed.  If no resolution in 4 weeks, plan for referral to dermatology  Handout on healthy lifestyle choices, including diet (rich in fruits, vegetables and lean meats and low in salt and simple carbohydrates) and exercise (at least 30 minutes of moderate physical activity daily).  Patient to follow up in 1 year for annual exam or sooner if needed.  Courage Biglow M. Lajuana Ripple, DO

## 2021-02-19 LAB — LIPID PANEL
Chol/HDL Ratio: 4.2 ratio (ref 0.0–4.4)
Cholesterol, Total: 212 mg/dL — ABNORMAL HIGH (ref 100–199)
HDL: 51 mg/dL (ref >39)
LDL Chol Calc (NIH): 146 mg/dL — ABNORMAL HIGH (ref 0–99)
Triglycerides: 82 mg/dL (ref 0–149)
VLDL Cholesterol Cal: 15 mg/dL (ref 5–40)

## 2021-02-19 LAB — CMP14+EGFR
ALT: 16 IU/L (ref 0–32)
AST: 21 IU/L (ref 0–40)
Albumin/Globulin Ratio: 1.7 (ref 1.2–2.2)
Albumin: 4.6 g/dL (ref 3.8–4.9)
Alkaline Phosphatase: 58 IU/L (ref 44–121)
BUN/Creatinine Ratio: 15 (ref 12–28)
BUN: 12 mg/dL (ref 8–27)
Bilirubin Total: 0.9 mg/dL (ref 0.0–1.2)
CO2: 22 mmol/L (ref 20–29)
Calcium: 9.7 mg/dL (ref 8.7–10.3)
Chloride: 104 mmol/L (ref 96–106)
Creatinine, Ser: 0.78 mg/dL (ref 0.57–1.00)
Globulin, Total: 2.7 g/dL (ref 1.5–4.5)
Glucose: 87 mg/dL (ref 65–99)
Potassium: 4.3 mmol/L (ref 3.5–5.2)
Sodium: 143 mmol/L (ref 134–144)
Total Protein: 7.3 g/dL (ref 6.0–8.5)
eGFR: 87 mL/min/{1.73_m2} (ref >59)

## 2021-02-19 LAB — CBC
Hematocrit: 36.3 % (ref 34.0–46.6)
Hemoglobin: 12.2 g/dL (ref 11.1–15.9)
MCH: 31.8 pg (ref 26.6–33.0)
MCHC: 33.6 g/dL (ref 31.5–35.7)
MCV: 95 fL (ref 79–97)
Platelets: 228 10*3/uL (ref 150–450)
RBC: 3.84 x10E6/uL (ref 3.77–5.28)
RDW: 13.1 % (ref 11.7–15.4)
WBC: 7.1 10*3/uL (ref 3.4–10.8)

## 2021-02-19 LAB — TSH: TSH: 2.06 u[IU]/mL (ref 0.450–4.500)

## 2021-02-19 LAB — VITAMIN D 25 HYDROXY (VIT D DEFICIENCY, FRACTURES): Vit D, 25-Hydroxy: 46.2 ng/mL (ref 30.0–100.0)

## 2021-02-24 ENCOUNTER — Encounter: Payer: Self-pay | Admitting: Family Medicine

## 2021-03-23 ENCOUNTER — Encounter: Payer: Self-pay | Admitting: Physician Assistant

## 2021-03-23 ENCOUNTER — Other Ambulatory Visit: Payer: Self-pay

## 2021-03-23 ENCOUNTER — Ambulatory Visit (INDEPENDENT_AMBULATORY_CARE_PROVIDER_SITE_OTHER): Payer: 59 | Admitting: Physician Assistant

## 2021-03-23 DIAGNOSIS — L814 Other melanin hyperpigmentation: Secondary | ICD-10-CM | POA: Diagnosis not present

## 2021-03-23 DIAGNOSIS — Z1283 Encounter for screening for malignant neoplasm of skin: Secondary | ICD-10-CM

## 2021-03-23 DIAGNOSIS — L820 Inflamed seborrheic keratosis: Secondary | ICD-10-CM

## 2021-03-23 DIAGNOSIS — D1800 Hemangioma unspecified site: Secondary | ICD-10-CM | POA: Diagnosis not present

## 2021-03-23 DIAGNOSIS — L578 Other skin changes due to chronic exposure to nonionizing radiation: Secondary | ICD-10-CM

## 2021-03-23 NOTE — Progress Notes (Signed)
   New Patient   Subjective  Carla Moreno is a 60 y.o. female who presents for the following: Annual Exam (Here for full body skin examination.  New raised lesion on left shoulder x 4 months- no itch no bleed just scaly. No history of melanoma or non mole skin cancers. ).   The following portions of the chart were reviewed this encounter and updated as appropriate:  Tobacco  Allergies  Meds  Problems  Med Hx  Surg Hx  Fam Hx      Objective  Well appearing patient in no apparent distress; mood and affect are within normal limits.  A full examination was performed including scalp, head, eyes, ears, nose, lips, neck, chest, axillae, abdomen, back, buttocks, bilateral upper extremities, bilateral lower extremities, hands, feet, fingers, toes, fingernails, and toenails. All findings within normal limits unless otherwise noted below.  Objective  left clavicle: Erythematous stuck-on, crusty plaque on a pink base.   Assessment & Plan  Inflamed seborrheic keratosis left clavicle  Observe. Ok to leave if stable  Lentigines - Scattered tan macules - Discussed due to sun exposure - Benign, observe - Call for any changes   Hemangiomas - Red papules - Discussed benign nature - Observe - Call for any changes  Actinic Damage - diffuse scaly erythematous macules with underlying dyspigmentation - Recommend daily broad spectrum sunscreen SPF 30+ to sun-exposed areas, reapply every 2 hours as needed.  - Call for new or changing lesions.  Skin cancer screening performed today.  I, Deetra Booton, PA-C, have reviewed all documentation for this visit. The documentation on 03/23/21 for the exam, diagnosis, procedures, and orders are all accurate and complete.   Address: [address] [place], [place] [address] Hours:  Open ? Closes 5PM Phone: [phone]

## 2021-05-10 ENCOUNTER — Ambulatory Visit (INDEPENDENT_AMBULATORY_CARE_PROVIDER_SITE_OTHER): Payer: 59 | Admitting: Nurse Practitioner

## 2021-05-10 ENCOUNTER — Other Ambulatory Visit: Payer: Self-pay

## 2021-05-10 VITALS — BP 128/53 | HR 98 | Temp 98.1°F | Ht 63.0 in | Wt 145.0 lb

## 2021-05-10 DIAGNOSIS — K5732 Diverticulitis of large intestine without perforation or abscess without bleeding: Secondary | ICD-10-CM

## 2021-05-10 HISTORY — DX: Diverticulitis of large intestine without perforation or abscess without bleeding: K57.32

## 2021-05-10 MED ORDER — CIPROFLOXACIN HCL 500 MG PO TABS: 500.0000 mg | ORAL_TABLET | Freq: Two times a day (BID) | ORAL | 0 refills | Status: DC

## 2021-05-10 MED ORDER — METRONIDAZOLE 500 MG PO TABS: 500.0000 mg | ORAL_TABLET | Freq: Two times a day (BID) | ORAL | 0 refills | Status: DC

## 2021-05-10 NOTE — Assessment & Plan Note (Signed)
Patient with a history of diverticulitis is reporting worsening pain after eating strawberries in the last few days with increased cramping and pain. I provided education to patient on dietary needs with diverticulitis. Started patient on Cipro 500 mg tablet by mouth twice daily for 10 days.  And Flagyl [metronidazole] 500 mg tablet by mouth twice daily for 10 days.  Rx sent to pharmacy.  Follow-up with worsening unresolved symptoms.

## 2021-05-10 NOTE — Patient Instructions (Signed)
Diverticulitis  Diverticulitis is when small pouches in your colon (large intestine) get infected or swollen. This causes pain in the belly (abdomen) and watery poop (diarrhea). These pouches are called diverticula. The pouches form in people who have a condition called diverticulosis. What are the causes? This condition may be caused by poop (stool) that gets trapped in the pouches in your colon. The poop lets germs (bacteria) grow in the pouches. This causes the infection. What increases the risk? You are more likely to get this condition if you have small pouches in your colon. The risk is higher if:  You are overweight or very overweight (obese).  You do not exercise enough.  You drink alcohol.  You smoke or use products with tobacco in them.  You eat a diet that has a lot of red meat such as beef, pork, or lamb.  You eat a diet that does not have enough fiber in it.  You are older than 60 years of age. What are the signs or symptoms?  Pain in the belly. Pain is often on the left side, but it may be in other areas.  Fever and feeling cold.  Feeling like you may vomit.  Vomiting.  Having cramps.  Feeling full.  Changes to how often you poop.  Blood in your poop. How is this treated? Most cases are treated at home by:  Taking over-the-counter pain medicines.  Following a clear liquid diet.  Taking antibiotic medicines.  Resting. Very bad cases may need to be treated at a hospital. This may include:  Not eating or drinking.  Taking prescription pain medicine.  Getting antibiotic medicines through an IV tube.  Getting fluid and food through an IV tube.  Having surgery. When you are feeling better, your doctor may tell you to have a test to check your colon (colonoscopy). Follow these instructions at home: Medicines  Take over-the-counter and prescription medicines only as told by your doctor. These include: ? Antibiotics. ? Pain medicines. ? Fiber  pills. ? Probiotics. ? Stool softeners.  If you were prescribed an antibiotic medicine, take it as told by your doctor. Do not stop taking the antibiotic even if you start to feel better.  Ask your doctor if the medicine prescribed to you requires you to avoid driving or using machinery. Eating and drinking  Follow a diet as told by your doctor.  When you feel better, your doctor may tell you to change your diet. You may need to eat a lot of fiber. Fiber makes it easier to poop (have a bowel movement). Foods with fiber include: ? Berries. ? Beans. ? Lentils. ? Green vegetables.  Avoid eating red meat.   General instructions  Do not use any products that contain nicotine or tobacco, such as cigarettes, e-cigarettes, and chewing tobacco. If you need help quitting, ask your doctor.  Exercise 3 or more times a week. Try to get 30 minutes each time. Exercise enough to sweat and make your heart beat faster.  Keep all follow-up visits as told by your doctor. This is important. Contact a doctor if:  Your pain does not get better.  You are not pooping like normal. Get help right away if:  Your pain gets worse.  Your symptoms do not get better.  Your symptoms get worse very fast.  You have a fever.  You vomit more than one time.  You have poop that is: ? Bloody. ? Black. ? Tarry. Summary  This condition happens when   small pouches in your colon get infected or swollen.  Take medicines only as told by your doctor.  Follow a diet as told by your doctor.  Keep all follow-up visits as told by your doctor. This is important. This information is not intended to replace advice given to you by your health care provider. Make sure you discuss any questions you have with your health care provider. Document Revised: 09/15/2019 Document Reviewed: 09/15/2019 Elsevier Patient Education  2021 Elsevier Inc.  

## 2021-05-10 NOTE — Progress Notes (Signed)
Acute Office Visit  Subjective:    Patient ID: Carla Moreno, female    DOB: 12-Feb-1961, 60 y.o.   MRN: [id number]  Chief Complaint  Patient presents with  . Abdominal Pain    Abdominal Pain This is a recurrent problem. Episode onset: In the past 3 to 4 days. The onset quality is sudden. The problem occurs constantly. The problem has been unchanged. The pain is located in the LUQ, suprapubic region and RLQ. The pain is moderate. The quality of the pain is aching and cramping. The abdominal pain does not radiate. The pain is relieved by nothing. Prior diagnostic workup includes lower endoscopy and upper endoscopy.     Past Medical History:  Diagnosis Date  . Hyperlipidemia   . Migraines     Past Surgical History:  Procedure Laterality Date  . ABDOMINAL HYSTERECTOMY  2001  . CESAREAN SECTION     2 occasions    Family History  Problem Relation Age of Onset  . Pneumonia Mother   . Hyperlipidemia Paternal Grandmother        diet controlled  . Migraines Neg Hx     Social History   Socioeconomic History  . Marital status: Married    Spouse name: Not on file  . Number of children: Not on file  . Years of education: Not on file  . Highest education level: Not on file  Occupational History  . Not on file  Tobacco Use  . Smoking status: Former Smoker    Packs/day: 0.50    Types: Cigarettes    Start date: 12/18/1976    Quit date: 12/18/1986    Years since quitting: 34.4  . Smokeless tobacco: Never Used  Substance and Sexual Activity  . Alcohol use: Yes    Alcohol/week: 2.0 standard drinks    Types: 2 Glasses of wine per week  . Drug use: No  . Sexual activity: Not on file  Other Topics Concern  . Not on file  Social History Narrative  . Not on file   Social Determinants of Health   Financial Resource Strain: Not on file  Food Insecurity: Not on file  Transportation Needs: Not on file  Physical Activity: Not on file  Stress: Not on file  Social  Connections: Not on file  Intimate Partner Violence: Not on file    Outpatient Medications Prior to Visit  Medication Sig Dispense Refill  . Ascorbic Acid (VITAMIN C PO) Take by mouth.    . fexofenadine-pseudoephedrine (ALLEGRA-D 24) 180-240 MG 24 hr tablet Take 1 tablet by mouth every evening. For allergy and congestion 30 tablet 11  . ibandronate (BONIVA) 150 MG tablet TAKE 1 TABLET IN THE MORNING WITH A FULL GLASS OF WATER ON AN EMPTY STOMACH ONCE EVERY 30 DAYS-DO NOT LIE DOWN FOR 30 MIN 12 tablet 1  . Multiple Vitamins-Minerals (ZINC PO) Take by mouth.    . Omega 3 1000 MG CAPS Take by mouth.    . Red Yeast Rice Extract (RED YEAST RICE PO) Take by mouth.    . SUMAtriptan (IMITREX) 100 MG tablet Take 1 tablet (100 mg total) by mouth every 2 (two) hours as needed for migraine. Do not exceed 2 tabs per 24 hours 9 tablet 1  . VITAMIN D PO Take by mouth.    . griseofulvin (GRIFULVIN V) 500 MG tablet Take 1 tablet (500 mg total) by mouth daily. 30 tablet 0   No facility-administered medications prior to visit.    No  Known Allergies  Review of Systems  Gastrointestinal: Positive for abdominal pain.       Objective:    Physical Exam  BP (!) 128/53   Pulse 98   Temp 98.1 F (36.7 C) (Temporal)   Ht 5' 3"  (1.6 m)   Wt 145 lb (65.8 kg)   BMI 25.69 kg/m  Wt Readings from Last 3 Encounters:  05/10/21 145 lb (65.8 kg)  02/18/21 145 lb (65.8 kg)  12/15/20 144 lb 6 oz (65.5 kg)    Health Maintenance Due  Topic Date Due  . HIV Screening  Never done  . COVID-19 Vaccine (4 - Booster for Moderna series) 03/02/2021  . MAMMOGRAM  03/05/2021    There are no preventive care reminders to display for this patient.   Lab Results  Component Value Date   TSH 2.060 02/18/2021   Lab Results  Component Value Date   WBC 7.1 02/18/2021   HGB 12.2 02/18/2021   HCT 36.3 02/18/2021   MCV 95 02/18/2021   PLT 228 02/18/2021   Lab Results  Component Value Date   NA 143 02/18/2021   K  4.3 02/18/2021   CO2 22 02/18/2021   GLUCOSE 87 02/18/2021   BUN 12 02/18/2021   CREATININE 0.78 02/18/2021   BILITOT 0.9 02/18/2021   ALKPHOS 58 02/18/2021   AST 21 02/18/2021   ALT 16 02/18/2021   PROT 7.3 02/18/2021   ALBUMIN 4.6 02/18/2021   CALCIUM 9.7 02/18/2021   EGFR 87 02/18/2021   Lab Results  Component Value Date   CHOL 212 (H) 02/18/2021   Lab Results  Component Value Date   HDL 51 02/18/2021   Lab Results  Component Value Date   LDLCALC 146 (H) 02/18/2021   Lab Results  Component Value Date   TRIG 82 02/18/2021   Lab Results  Component Value Date   CHOLHDL 4.2 02/18/2021   No results found for: HGBA1C     Assessment & Plan:   Problem List Items Addressed This Visit      Digestive   Diverticulitis of large intestine without perforation or abscess without bleeding - Primary    Patient with a history of diverticulitis is reporting worsening pain after eating strawberries in the last few days with increased cramping and pain. I provided education to patient on dietary needs with diverticulitis. Started patient on Cipro 500 mg tablet by mouth twice daily for 10 days.  And Flagyl [metronidazole] 500 mg tablet by mouth twice daily for 10 days.  Rx sent to pharmacy.  Follow-up with worsening unresolved symptoms.        Relevant Medications   metroNIDAZOLE (FLAGYL) 500 MG tablet   ciprofloxacin (CIPRO) 500 MG tablet       Meds ordered this encounter  Medications  . metroNIDAZOLE (FLAGYL) 500 MG tablet    Sig: Take 1 tablet (500 mg total) by mouth 2 (two) times daily.    Dispense:  20 tablet    Refill:  0    Order Specific Question:   Supervising Provider    Answer:   Janora Norlander [[id number]]  . ciprofloxacin (CIPRO) 500 MG tablet    Sig: Take 1 tablet (500 mg total) by mouth 2 (two) times daily.    Dispense:  20 tablet    Refill:  0    Order Specific Question:   Supervising Provider    Answer:   Janora Norlander [[id number]]      Ivy Lynn, NP

## 2021-05-23 ENCOUNTER — Telehealth: Payer: Self-pay | Admitting: *Deleted

## 2021-05-23 NOTE — Telephone Encounter (Signed)
Dr Carlean Purl,  This pt is scheduled for a PV 6-15 and a Colon for 6-29- she saw her PCP on 5-24 2022 for Diverticulitis-  Started on Cipro and Flagyl at that [facility]- Last colon 2012 with Dr Sharlett Iles, tics only.   Does she need to postpone her Colon or okay to proceed as scheduled?  Thanks for your time, Lelan Pons

## 2021-05-24 NOTE — Telephone Encounter (Signed)
I think if she is asymptomatic on June 15 we can proceed.  I will route back to me also to remember to follow-up on this when I am back week of 6/13

## 2021-05-25 NOTE — Telephone Encounter (Signed)
Thank you Dr Carlean Purl!

## 2021-06-01 ENCOUNTER — Ambulatory Visit (AMBULATORY_SURGERY_CENTER): Payer: 59

## 2021-06-01 ENCOUNTER — Other Ambulatory Visit: Payer: Self-pay

## 2021-06-01 ENCOUNTER — Telehealth: Payer: Self-pay

## 2021-06-01 VITALS — Ht 63.0 in | Wt 145.0 lb

## 2021-06-01 DIAGNOSIS — Z1211 Encounter for screening for malignant neoplasm of colon: Secondary | ICD-10-CM

## 2021-06-01 NOTE — Telephone Encounter (Signed)
Dr. Carlean Purl:  Pt states that her Diverticulitis flare has resolved and she has completed her antibiotic course.  Thank you

## 2021-06-01 NOTE — Progress Notes (Signed)
Pt verified name, DOB, address and insurance during PV today.   Pt mailed instruction packet to includecopy of consent form to read and not return, and instructions.  PV completed over the phone. Pt encouraged to call with questions or issues.  No allergies to soy or egg Pt is not on blood thinners or diet pills Denies issues with sedation/intubation Denies atrial flutter/fib Denies constipation   Emmi instructions given to pt  Pt is aware of Covid safety and care partner requirements.   Pt states that her flare has subsided at this point.  Has completed her course of Flagy and Cipro. TEsent to Dr. Carlean Purl for update  Pt told to stop Metamucil x 5 days prior to procedure

## 2021-06-08 ENCOUNTER — Encounter: Payer: Self-pay | Admitting: Physician Assistant

## 2021-06-08 ENCOUNTER — Ambulatory Visit (INDEPENDENT_AMBULATORY_CARE_PROVIDER_SITE_OTHER): Payer: 59 | Admitting: Physician Assistant

## 2021-06-08 DIAGNOSIS — J069 Acute upper respiratory infection, unspecified: Secondary | ICD-10-CM | POA: Diagnosis not present

## 2021-06-08 MED ORDER — AMOXICILLIN 875 MG PO TABS: 875.0000 mg | ORAL_TABLET | Freq: Two times a day (BID) | ORAL | 0 refills | Status: AC

## 2021-06-08 NOTE — Progress Notes (Signed)
   Virtual Visit  Note Due to COVID-19 pandemic this visit was conducted virtually. This visit type was conducted due to national recommendations for restrictions regarding the COVID-19 Pandemic (e.g. social distancing, sheltering in place) in an effort to limit this patient's exposure and mitigate transmission in our community. All issues noted in this document were discussed and addressed.  A physical exam was not performed with this format.  I connected with Carla Moreno on 06/08/21  by telephone and verified that I am speaking with the correct person using two identifiers. [name] is currently located at home an is currently with no one during visit. The provider, Thomasene Mohair, PA-C is located in their office at time of visit.  I discussed the limitations, risks, security and privacy concerns of performing an evaluation and management service by telephone and the availability of in person appointments. I also discussed with the patient that there may be a patient responsible charge related to this service. The patient expressed understanding and agreed to proceed.   History and Present Illness:  Pt with a 1 week hx of sinus congestion and headache Intermit cough and fatigue Neg at home COVID test x 4 Went to Urgent Care and strep neg Placed on 3 tabs of Zithromax but sx continue Using OTC meds for sx + Hx of COVID in Feb + vaccine/booster - Moderna     Review of Systems  Constitutional:  Positive for malaise/fatigue. Negative for chills and fever.  HENT:  Positive for congestion and sinus pain. Negative for sore throat.   Respiratory:  Positive for cough. Negative for sputum production, shortness of breath and wheezing.   Cardiovascular: Negative.   Musculoskeletal:  Positive for myalgias.  Skin: Negative.     Observations/Objective: defer  Assessment and Plan: URI  Amox 875mg  rx Fluids Rest OTC meds for sx   Follow Up Instructions: F/U prn     I discussed the assessment and treatment plan with the patient. The patient was provided an opportunity to ask questions and all were answered. The patient agreed with the plan and demonstrated an understanding of the instructions.   The patient was advised to call back or seek an in-person evaluation if the symptoms worsen or if the condition fails to improve as anticipated.  The above assessment and management plan was discussed with the patient. The patient verbalized understanding of and has agreed to the management plan. Patient is aware to call the clinic if symptoms persist or worsen. Patient is aware when to return to the clinic for a follow-up visit. Patient educated on when it is appropriate to go to the emergency department.   Time call ended:    I provided 11 minutes of  non face-to-face time during this encounter.    Thomasene Mohair, PA-C

## 2021-06-08 NOTE — Patient Instructions (Signed)

## 2021-06-09 ENCOUNTER — Encounter: Payer: Self-pay | Admitting: Internal Medicine

## 2021-06-15 ENCOUNTER — Encounter: Payer: 59 | Admitting: Internal Medicine

## 2021-08-11 ENCOUNTER — Ambulatory Visit (AMBULATORY_SURGERY_CENTER): Payer: 59 | Admitting: *Deleted

## 2021-08-11 ENCOUNTER — Other Ambulatory Visit: Payer: Self-pay

## 2021-08-11 VITALS — Ht 63.0 in | Wt 145.0 lb

## 2021-08-11 DIAGNOSIS — Z1211 Encounter for screening for malignant neoplasm of colon: Secondary | ICD-10-CM

## 2021-08-11 NOTE — Progress Notes (Signed)
Pt verified name, DOB, address and insurance during PV today.  Pt mailed instruction packet of Emmi video, copy of consent form to read and not return, and instructions.  PV completed over the phone.  Pt encouraged to call with questions or issues.  My Chart instructions to pt as well    No egg or soy allergy known to patient  No issues with past sedation with any surgeries or procedures Patient denies ever being told they had issues or difficulty with intubation  No FH of Malignant Hyperthermia No diet pills per patient No home 02 use per patient  No blood thinners per patient  Pt denies issues with constipation - uses Miralax 4 x a week and has soft regular BM's No A fib or A flutter  EMMI video to pt or via [place] 19 guidelines implemented in PV today with Pt and RN   Pt is fully vaccinated  for Covid  Due to the COVID-19 pandemic we are asking patients to follow certain guidelines.  Pt aware of COVID protocols and [facility] guidelines

## 2021-08-16 ENCOUNTER — Encounter: Payer: Self-pay | Admitting: Internal Medicine

## 2021-08-22 ENCOUNTER — Encounter: Payer: Self-pay | Admitting: Internal Medicine

## 2021-08-25 ENCOUNTER — Encounter: Payer: Self-pay | Admitting: Internal Medicine

## 2021-08-25 ENCOUNTER — Ambulatory Visit (AMBULATORY_SURGERY_CENTER): Payer: 59 | Admitting: Internal Medicine

## 2021-08-25 ENCOUNTER — Other Ambulatory Visit: Payer: Self-pay

## 2021-08-25 VITALS — BP 110/72 | HR 76 | Temp 98.9°F | Resp 16 | Ht 63.0 in | Wt 145.0 lb

## 2021-08-25 DIAGNOSIS — D120 Benign neoplasm of cecum: Secondary | ICD-10-CM | POA: Diagnosis not present

## 2021-08-25 DIAGNOSIS — Z86010 Personal history of colonic polyps: Secondary | ICD-10-CM

## 2021-08-25 DIAGNOSIS — Z1211 Encounter for screening for malignant neoplasm of colon: Secondary | ICD-10-CM | POA: Diagnosis not present

## 2021-08-25 DIAGNOSIS — Z860101 Personal history of adenomatous and serrated colon polyps: Secondary | ICD-10-CM

## 2021-08-25 HISTORY — DX: Personal history of adenomatous and serrated colon polyps: Z86.0101

## 2021-08-25 HISTORY — DX: Personal history of colonic polyps: Z86.010

## 2021-08-25 MED ORDER — SODIUM CHLORIDE 0.9 % IV SOLN: 500.0000 mL | Freq: Once | INTRAVENOUS | Status: DC

## 2021-08-25 NOTE — Progress Notes (Signed)
PT taken to PACU. Monitors in place. VSS. Report given to RN. 

## 2021-08-25 NOTE — Patient Instructions (Addendum)
I found and removed 2 tiny polyps. You do have diverticulosis as you know - thickened muscle rings and pouches in the colon wall. Please read the handout about this condition.  I will let you know pathology results and when to have another routine colonoscopy by mail and/or My Chart.  I appreciate the opportunity to care for you. Gatha Mayer, MD, [facility]  Handouts given for polyps and diverticulosis.   YOU HAD AN ENDOSCOPIC PROCEDURE TODAY AT [facility]:   Refer to the procedure report that was given to you for any specific questions about what was found during the examination.  If the procedure report does not answer your questions, please call your gastroenterologist to clarify.  If you requested that your care partner not be given the details of your procedure findings, then the procedure report has been included in a sealed envelope for you to review at your convenience later.  YOU SHOULD EXPECT: Some feelings of bloating in the abdomen. Passage of more gas than usual.  Walking can help get rid of the air that was put into your GI tract during the procedure and reduce the bloating. If you had a lower endoscopy (such as a colonoscopy or flexible sigmoidoscopy) you may notice spotting of blood in your stool or on the toilet paper. If you underwent a bowel prep for your procedure, you may not have a normal bowel movement for a few days.  Please Note:  You might notice some irritation and congestion in your nose or some drainage.  This is from the oxygen used during your procedure.  There is no need for concern and it should clear up in a day or so.  SYMPTOMS TO REPORT IMMEDIATELY:  Following lower endoscopy (colonoscopy or flexible sigmoidoscopy):  Excessive amounts of blood in the stool  Significant tenderness or worsening of abdominal pains  Swelling of the abdomen that is new, acute  Fever of 100F or higher  For urgent or emergent issues, a gastroenterologist can be  reached at any hour by calling [phone]. Do not use MyChart messaging for urgent concerns.    DIET:  We do recommend a small meal at first, but then you may proceed to your regular diet.  Drink plenty of fluids but you should avoid alcoholic beverages for 24 hours.  ACTIVITY:  You should plan to take it easy for the rest of today and you should NOT DRIVE or use heavy machinery until tomorrow (because of the sedation medicines used during the test).    FOLLOW UP: Our staff will call the number listed on your records 48-72 hours following your procedure to check on you and address any questions or concerns that you may have regarding the information given to you following your procedure. If we do not reach you, we will leave a message.  We will attempt to reach you two times.  During this call, we will ask if you have developed any symptoms of COVID 19. If you develop any symptoms (ie: fever, flu-like symptoms, shortness of breath, cough etc.) before then, please call [phone].  If you test positive for Covid 19 in the 2 weeks post procedure, please call and report this information to [place].    If any biopsies were taken you will be contacted by phone or by letter within the next 1-3 weeks.  Please call us at [phone] if you have not heard about the biopsies in 3 weeks.    SIGNATURES/CONFIDENTIALITY: You and/or  your care partner have signed paperwork which will be entered into your electronic medical record.  These signatures attest to the fact that that the information above on your After Visit Summary has been reviewed and is understood.  Full responsibility of the confidentiality of this discharge information lies with you and/or your care-partner.

## 2021-08-25 NOTE — Progress Notes (Signed)
Called to room to assist during endoscopic procedure.  Patient ID and intended procedure confirmed with present staff. Received instructions for my participation in the procedure from the performing physician.  

## 2021-08-25 NOTE — Op Note (Signed)
[facility] Patient Name: Carla Moreno Procedure Date: 08/25/2021 11:14 AM MRN: [id number] Endoscopist: Gatha Mayer , MD Age: 60 Referring MD:  Date of Birth: 07-16-1961 Gender: Female Account #: [id number] Procedure:                Colonoscopy Indications:              Screening for colorectal malignant neoplasm, Last                            colonoscopy: 2012 Medicines:                Propofol per Anesthesia, Monitored Anesthesia Care Procedure:                Pre-Anesthesia Assessment:                           - Prior to the procedure, a History and Physical                            was performed, and patient medications and                            allergies were reviewed. The patient's tolerance of                            previous anesthesia was also reviewed. The risks                            and benefits of the procedure and the sedation                            options and risks were discussed with the patient.                            All questions were answered, and informed consent                            was obtained. Prior Anticoagulants: The patient has                            taken no previous anticoagulant or antiplatelet                            agents. ASA Grade Assessment: II - A patient with                            mild systemic disease. After reviewing the risks                            and benefits, the patient was deemed in                            satisfactory condition to undergo the procedure.  After obtaining informed consent, the colonoscope                            was passed under direct vision. Throughout the                            procedure, the patient's blood pressure, pulse, and                            oxygen saturations were monitored continuously. The                            PCF-HQ190L Colonoscope was introduced through the                            anus and  advanced to the the cecum, identified by                            appendiceal orifice and ileocecal valve. The                            colonoscopy was performed without difficulty. The                            patient tolerated the procedure well. The quality                            of the bowel preparation was adequate. The                            ileocecal valve, appendiceal orifice, and rectum                            were photographed. The bowel preparation used was                            Miralax via split dose instruction. Scope In: 11:27:04 AM Scope Out: 11:44:56 AM Scope Withdrawal Time: 0 hours 11 minutes 46 seconds  Total Procedure Duration: 0 hours 17 minutes 52 seconds  Findings:                 The perianal and digital rectal examinations were                            normal.                           Two flat and sessile polyps were found in the                            cecum. The polyps were diminutive in size. These                            polyps were removed with a cold snare. Resection  and retrieval were complete. Verification of                            patient identification for the specimen was done.                            Estimated blood loss was minimal.                           Many small and large-mouthed diverticula were found                            in the sigmoid colon and descending colon.                           The exam was otherwise without abnormality on                            direct and retroflexion views. Complications:            No immediate complications. Estimated Blood Loss:     Estimated blood loss was minimal. Impression:               - Two diminutive polyps in the cecum, removed with                            a cold snare. Resected and retrieved.                           - Severe diverticulosis in the sigmoid colon and in                            the descending colon.                            - The examination was otherwise normal on direct                            and retroflexion views. Recommendation:           - Patient has a contact number available for                            emergencies. The signs and symptoms of potential                            delayed complications were discussed with the                            patient. Return to normal activities tomorrow.                            Written discharge instructions were provided to the                            patient.                           -  Resume previous diet.                           - Continue present medications.                           - Await pathology results.                           - Repeat colonoscopy is recommended. The                            colonoscopy date will be determined after pathology                            results from today's exam become available for                            review. Gatha Mayer, MD 08/25/2021 11:52:19 AM This report has been signed electronically.

## 2021-08-25 NOTE — Progress Notes (Signed)
[facility] History and Physical   Primary Care Physician:  Janora Norlander, DO   Reason for Procedure:   Colon cancer screening  Plan:    colonoscopy     HPI: Carla Moreno is a 60 y.o. female here for screening colonoscopy. Did have clinical dx diverticulitis 5/22.  Past Medical History:  Diagnosis Date   Allergy    enviromental   Hyperlipidemia    Migraines    past hx   Osteoporosis     Past Surgical History:  Procedure Laterality Date   ABDOMINAL HYSTERECTOMY  12/19/1999   CESAREAN SECTION     2 occasions   COLONOSCOPY  09/17/2020   TUBAL LIGATION     prior to hysterectomy    Prior to Admission medications   Medication Sig Start Date End Date Taking? Authorizing Provider  CALCIUM PO Take by mouth.   Yes [provider]  cholecalciferol (VITAMIN D3) 25 MCG (1000 UNIT) tablet Take 1,000 Units by mouth daily.   Yes [provider]  Cyanocobalamin (VITAMIN B-12 PO) Take by mouth.   Yes [provider]  fexofenadine-pseudoephedrine (ALLEGRA-D 24) 180-240 MG 24 hr tablet Take 1 tablet by mouth every evening. For allergy and congestion 06/14/20  Yes Stacks, Cletus Gash, MD  Omega 3 1000 MG CAPS Take by mouth.   Yes [provider]  polyethylene glycol (MIRALAX / GLYCOLAX) 17 g packet Take 17 g by mouth daily. Uses at least 4 x a week   Yes [provider]  Ascorbic Acid (VITAMIN C PO) Take by mouth.    [provider]  ibandronate (BONIVA) 150 MG tablet TAKE 1 TABLET IN THE MORNING WITH A FULL GLASS OF WATER ON AN EMPTY STOMACH ONCE EVERY 30 DAYS-DO NOT LIE DOWN FOR 30 MIN 10/11/20   Gottschalk, Ashly M, DO  SUMAtriptan (IMITREX) 100 MG tablet Take 1 tablet (100 mg total) by mouth every 2 (two) hours as needed for migraine. Do not exceed 2 tabs per 24 hours 02/06/19   Dennie Bible, NP    Current Outpatient Medications  Medication Sig Dispense Refill   CALCIUM PO Take by mouth.      cholecalciferol (VITAMIN D3) 25 MCG (1000 UNIT) tablet Take 1,000 Units by mouth daily.     Cyanocobalamin (VITAMIN B-12 PO) Take by mouth.     fexofenadine-pseudoephedrine (ALLEGRA-D 24) 180-240 MG 24 hr tablet Take 1 tablet by mouth every evening. For allergy and congestion 30 tablet 11   Omega 3 1000 MG CAPS Take by mouth.     polyethylene glycol (MIRALAX / GLYCOLAX) 17 g packet Take 17 g by mouth daily. Uses at least 4 x a week     Ascorbic Acid (VITAMIN C PO) Take by mouth.     ibandronate (BONIVA) 150 MG tablet TAKE 1 TABLET IN THE MORNING WITH A FULL GLASS OF WATER ON AN EMPTY STOMACH ONCE EVERY 30 DAYS-DO NOT LIE DOWN FOR 30 MIN 12 tablet 1   SUMAtriptan (IMITREX) 100 MG tablet Take 1 tablet (100 mg total) by mouth every 2 (two) hours as needed for migraine. Do not exceed 2 tabs per 24 hours 9 tablet 1   Current Facility-Administered Medications  Medication Dose Route Frequency Provider Last Rate Last Admin   0.9 %  sodium chloride infusion  500 mL Intravenous Once Gatha Mayer, MD        Allergies as of 08/25/2021   (No Known Allergies)    Family History  Problem Relation  Age of Onset   Pneumonia Mother    Hyperlipidemia Paternal Grandmother        diet controlled   Migraines Neg Hx    Colon cancer Neg Hx    Colon polyps Neg Hx    Esophageal cancer Neg Hx    Rectal cancer Neg Hx    Stomach cancer Neg Hx     Social History   Socioeconomic History   Marital status: Married    Spouse name: Not on file   Number of children: Not on file   Years of education: Not on file   Highest education level: Not on file  Occupational History   Not on file  Tobacco Use   Smoking status: Former    Packs/day: 0.50    Types: Cigarettes    Start date: 12/18/1976    Quit date: 12/18/1986    Years since quitting: 34.7   Smokeless tobacco: Never  Vaping Use   Vaping Use: Never used  Substance and Sexual Activity   Alcohol use: Yes    Alcohol/week: 2.0 standard drinks    Types: 2  Glasses of wine per week   Drug use: No   Sexual activity: Not on file  Other Topics Concern   Not on file  Social History Narrative   Not on file   Social Determinants of Health   Financial Resource Strain: Not on file  Food Insecurity: Not on file  Transportation Needs: Not on file  Physical Activity: Not on file  Stress: Not on file  Social Connections: Not on file  Intimate Partner Violence: Not on file    Review of Systems:  other review of systems negative except as mentioned in the HPI.  Physical Exam: Vital signs BP 128/79   Pulse 89   Temp 98.9 F (37.2 C)   Ht '5\' 3"'$  (1.6 m)   Wt 145 lb (65.8 kg)   SpO2 100%   BMI 25.69 kg/m   General:   Alert,  Well-developed, well-nourished, pleasant and cooperative in NAD Lungs:  Clear throughout to auscultation.   Heart:  Regular rate and rhythm; no murmurs, clicks, rubs,  or gallops. Abdomen:  Soft, nontender and nondistended. Normal bowel sounds.   Neuro/Psych:  Alert and cooperative. Normal mood and affect. A and O x 3   '@Rita Vialpando'$  Simonne Maffucci, MD, [facility] [phone] (pager) 08/25/2021 11:16 AM@

## 2021-08-25 NOTE — Progress Notes (Signed)
VS completed by CW.   Pt's states no medical or surgical changes since previsit or office visit.  

## 2021-08-29 ENCOUNTER — Telehealth: Payer: Self-pay

## 2021-08-29 ENCOUNTER — Telehealth: Payer: Self-pay | Admitting: *Deleted

## 2021-08-29 NOTE — Telephone Encounter (Signed)
Not available for follow up call.

## 2021-08-29 NOTE — Telephone Encounter (Signed)
  Follow up Call-  Call back number 08/25/2021  Post procedure Call Back phone  # [phone]  Permission to leave phone message Yes  Some recent data might be hidden     Patient questions:  Do you have a fever, pain , or abdominal swelling? No. Pain Score  0 *  Have you tolerated food without any problems? Yes.    Have you been able to return to your normal activities? Yes.    Do you have any questions about your discharge instructions: Diet   No. Medications  No. Follow up visit  No.  Do you have questions or concerns about your Care? No.  Actions: * If pain score is 4 or above: No action needed, pain <4.  Have you developed a fever since your procedure? no  2.   Have you had an respiratory symptoms (SOB or cough) since your procedure? no  3.   Have you tested positive for COVID 19 since your procedure no  4.   Have you had any family members/close contacts diagnosed with the COVID 19 since your procedure?  no   If yes to any of these questions please route to Joylene John, RN and Joella Prince, RN

## 2021-09-02 ENCOUNTER — Encounter: Payer: Self-pay | Admitting: Internal Medicine

## 2021-09-02 DIAGNOSIS — Z86010 Personal history of colonic polyps: Secondary | ICD-10-CM

## 2021-09-07 ENCOUNTER — Encounter: Payer: Self-pay | Admitting: Family Medicine

## 2021-09-08 IMAGING — MG DIGITAL SCREENING BILAT W/ TOMO W/ CAD
8 series · 9 of 24 positions shown · non-contrast
Comparison: Previous exam(s).

CLINICAL DATA: Screening.

EXAM:
DIGITAL SCREENING BILATERAL MAMMOGRAM WITH TOMO AND CAD

[L MLO synth-2D]
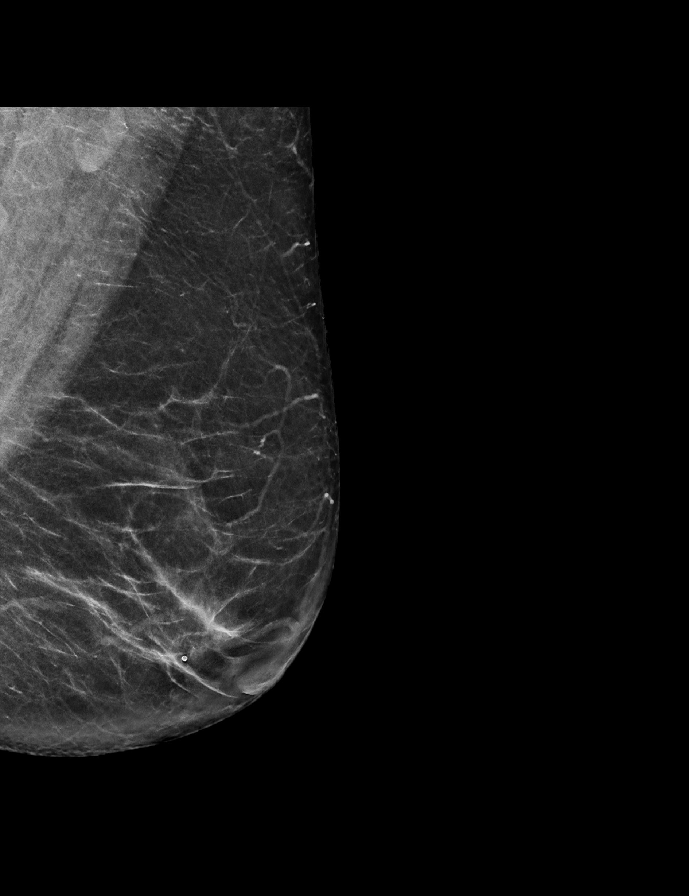

[R CC synth-2D]
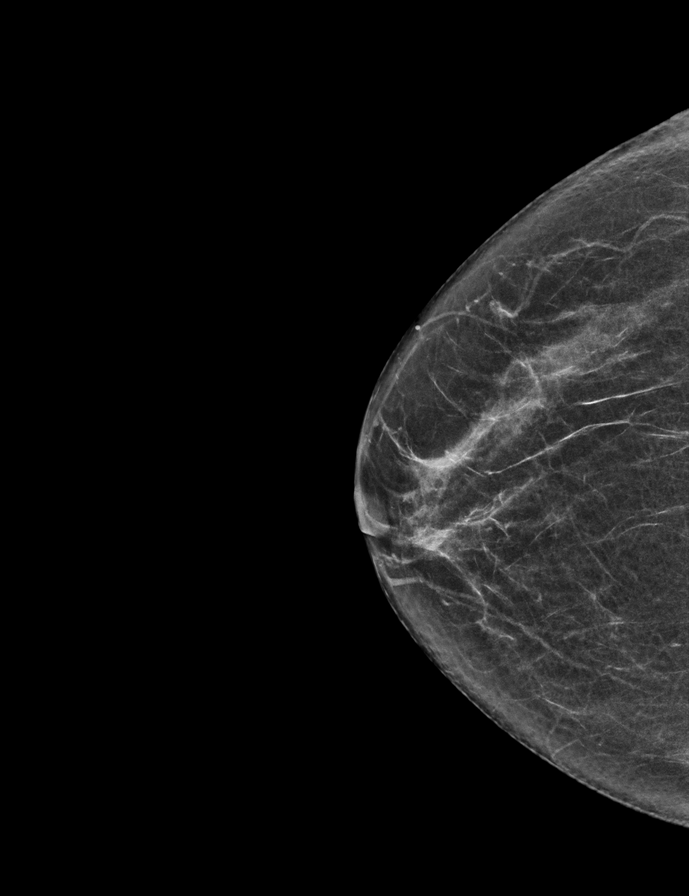

[L CC synth-2D]
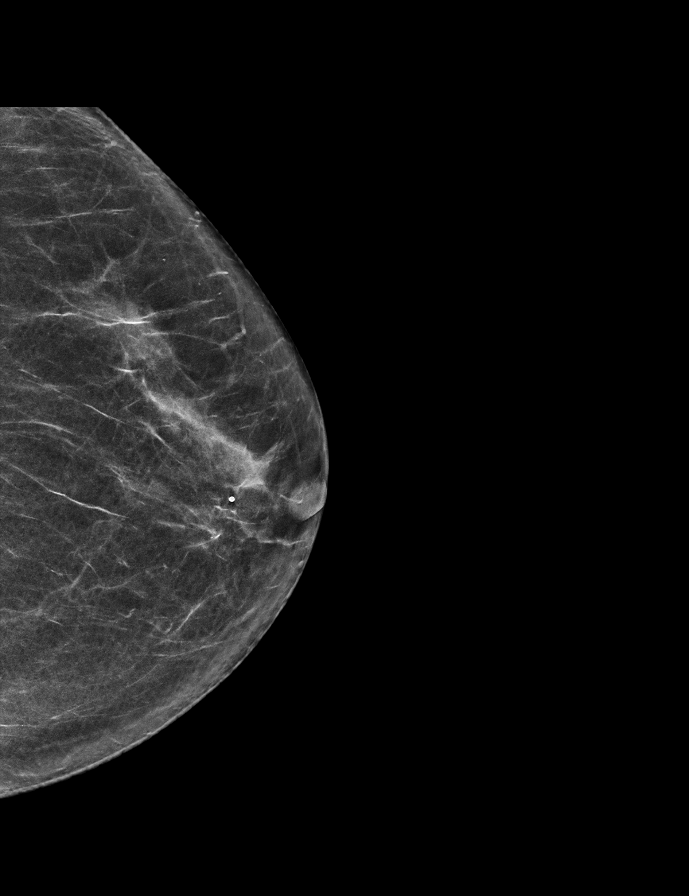

[R MLO synth-2D]
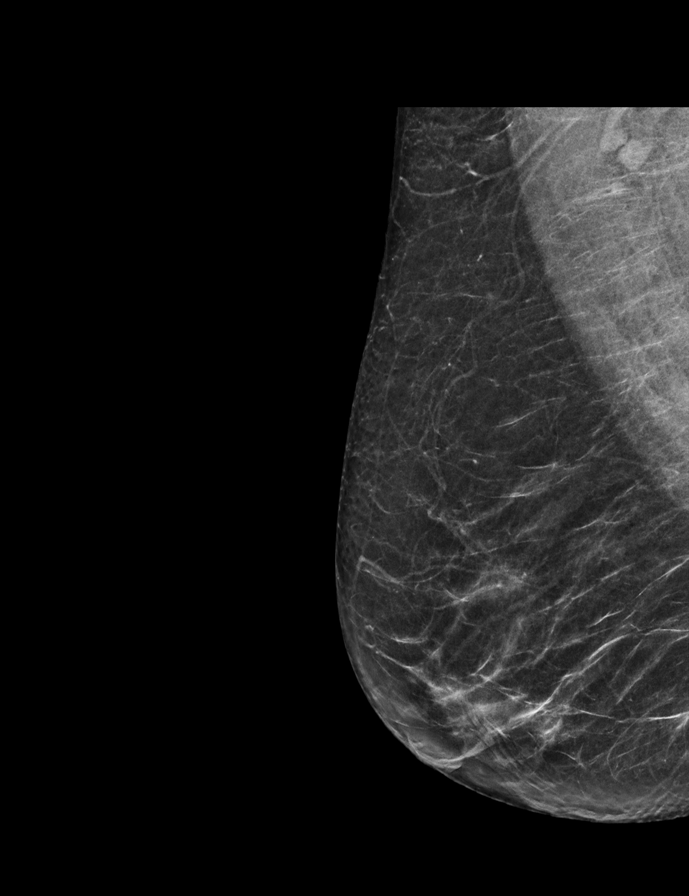

[R CC tomo · 2 of 56 frames shown]
[frame 19/56]
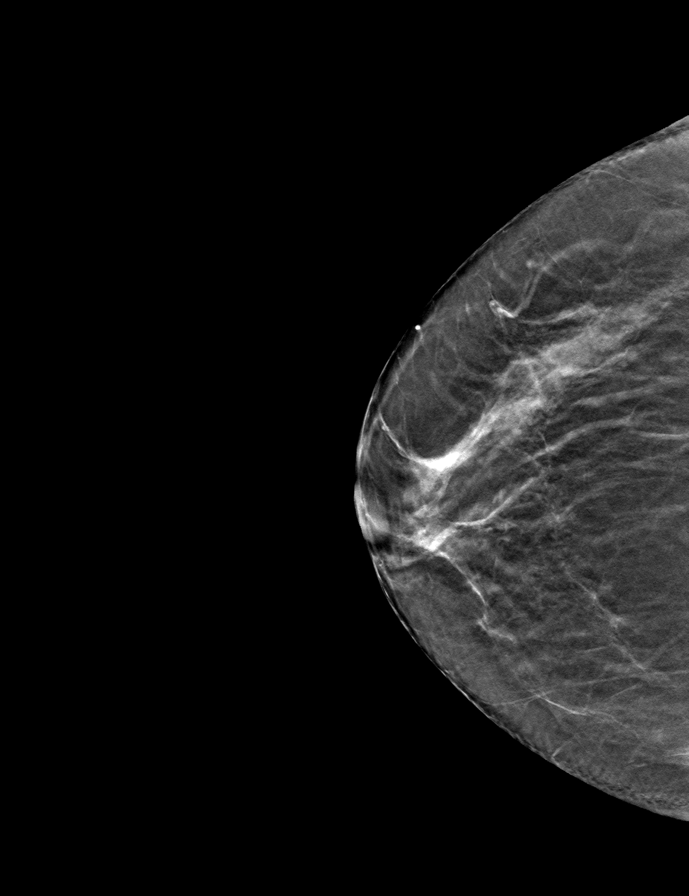
[frame 29/56]
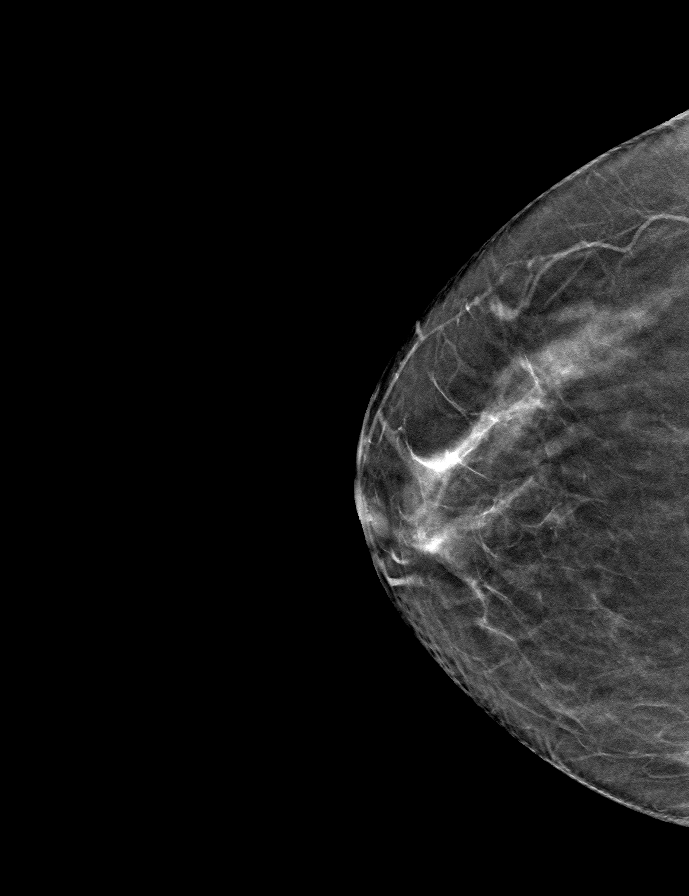

[R MLO tomo · tomo slice 30/59.0]
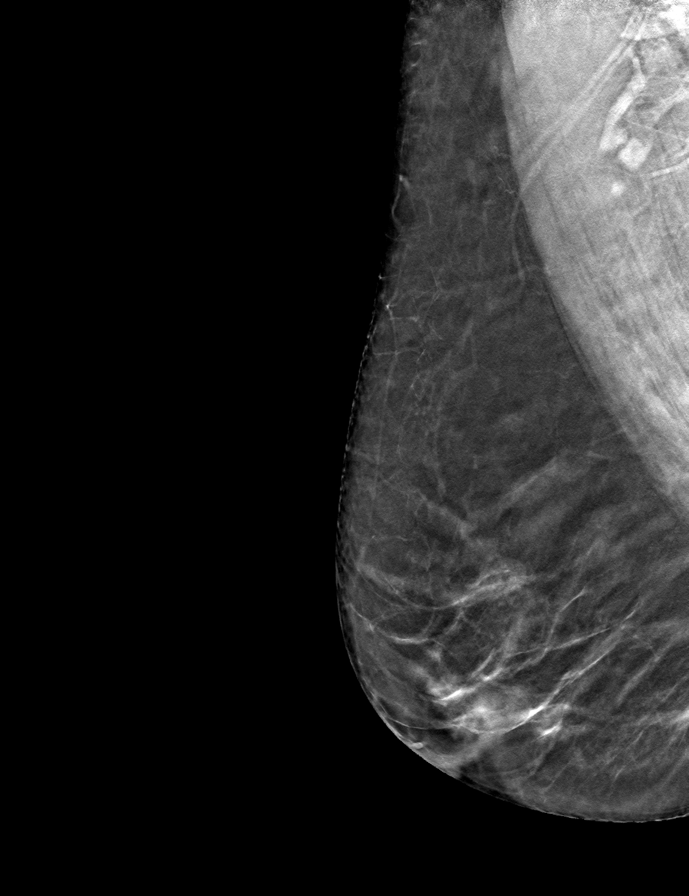

[L MLO tomo · tomo slice 34/67.0]
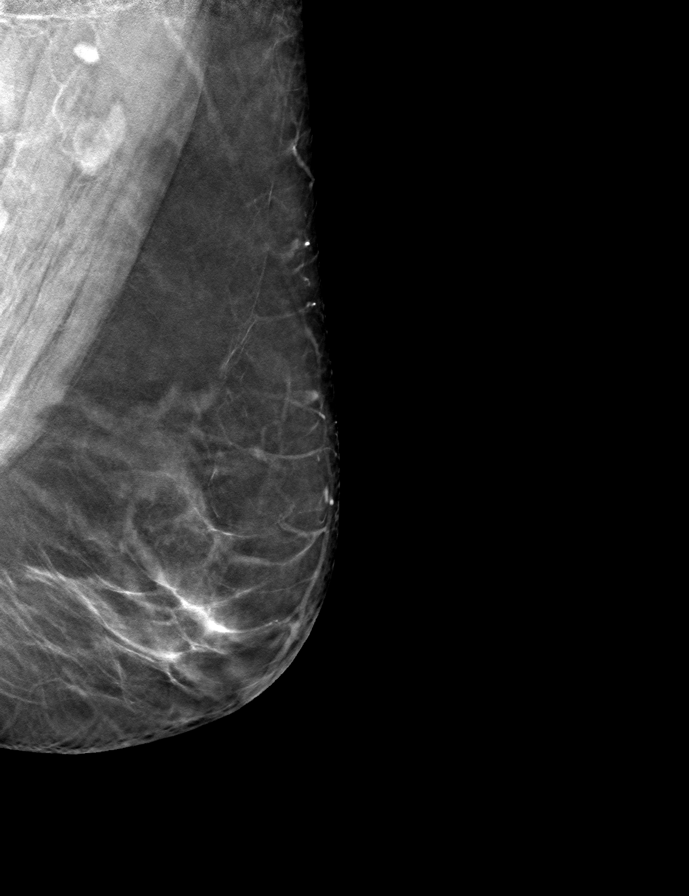

[L CC tomo · tomo slice 31/60.0]
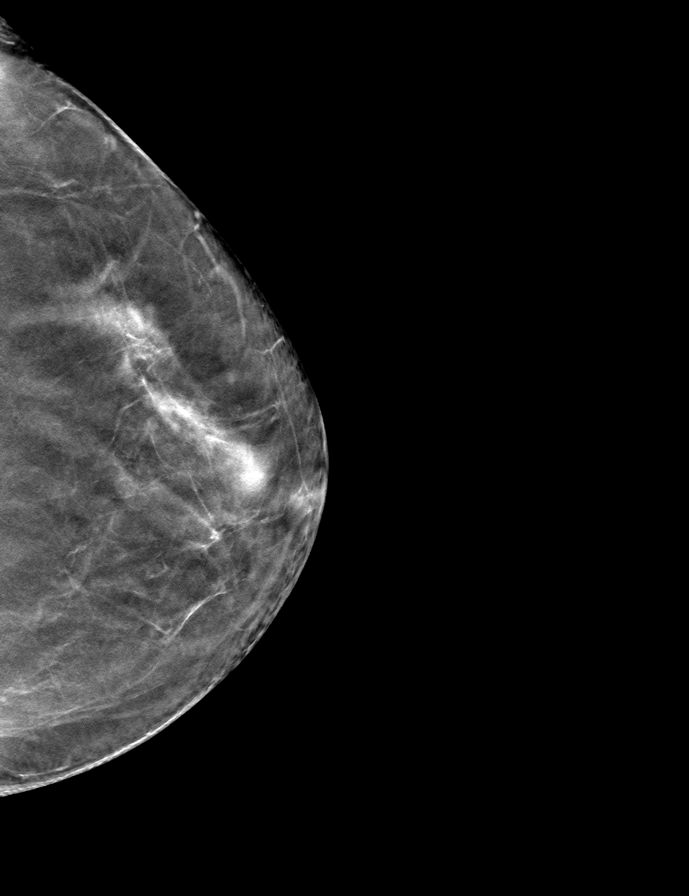

[9 of 24 positions shown; findings below may reference images not displayed]

ACR Breast Density Category b: There are scattered areas of
fibroglandular density.
FINDINGS: There are no findings suspicious for malignancy. Images were
processed with CAD.
IMPRESSION: No mammographic evidence of malignancy. A result letter of this
screening mammogram will be mailed directly to the patient.

RECOMMENDATION:
Screening mammogram in one year. (Code:[id number]-775)

BI-RADS CATEGORY  1: Negative.

## 2021-09-16 ENCOUNTER — Encounter: Payer: Self-pay | Admitting: Family Medicine

## 2021-09-16 ENCOUNTER — Other Ambulatory Visit: Payer: Self-pay

## 2021-09-16 ENCOUNTER — Ambulatory Visit (INDEPENDENT_AMBULATORY_CARE_PROVIDER_SITE_OTHER): Payer: 59 | Admitting: Family Medicine

## 2021-09-16 VITALS — BP 116/65 | HR 73 | Temp 97.5°F | Ht 63.0 in | Wt 145.2 lb

## 2021-09-16 DIAGNOSIS — Z23 Encounter for immunization: Secondary | ICD-10-CM | POA: Diagnosis not present

## 2021-09-16 DIAGNOSIS — G43009 Migraine without aura, not intractable, without status migrainosus: Secondary | ICD-10-CM

## 2021-09-16 DIAGNOSIS — M810 Age-related osteoporosis without current pathological fracture: Secondary | ICD-10-CM

## 2021-09-16 MED ORDER — RISEDRONATE SODIUM 150 MG PO TABS: 150.0000 mg | ORAL_TABLET | ORAL | 4 refills | Status: DC

## 2021-09-16 MED ORDER — NURTEC 75 MG PO TBDP: 1.0000 | ORAL_TABLET | Freq: Every day | ORAL | 0 refills | Status: DC | PRN

## 2021-09-16 MED ORDER — SUMATRIPTAN SUCCINATE 100 MG PO TABS: 50.0000 mg | ORAL_TABLET | ORAL | 1 refills | Status: DC | PRN

## 2021-09-16 NOTE — Progress Notes (Signed)
Subjective: Carla Moreno PCP: Janora Norlander, DO GEX:[id number] Carla Moreno is a 60 y.o. female presenting to clinic today for:  1. Migraines Patient has that she has discontinued seeing the neurologist.  She was simply seeing 1 for as needed Imitrex.  She uses this less than once every couple of months, more so in the change of season months.  She typically only takes half a tablet when she does take the medicine and only if symptoms or not relieved by Excedrin.  She has not tried Nurtec but she would like to try something to have less risk associated with it.  Denies any associated aura with migraines.  She does sometimes have nausea, photophobia and phonophobia when the headaches are severe.  2.  Osteoporosis Patient reports tolerance of Boniva but does sometimes have some joint aching.  Wants to know when her next bone density test should be.   ROS: Per HPI  No Known Allergies Past Medical History:  Diagnosis Date   Allergy    enviromental   Diverticulitis of large intestine without perforation or abscess without bleeding 05/10/2021   Hx of adenomatous colonic polyps 08/25/2021   2 diminutive   Hyperlipidemia    Migraines    past hx   Osteoporosis     Current Outpatient Medications:    CALCIUM PO, Take by mouth., Disp: , Rfl:    cholecalciferol (VITAMIN D3) 25 MCG (1000 UNIT) tablet, Take 1,000 Units by mouth daily., Disp: , Rfl:    Cyanocobalamin (VITAMIN B-12 PO), Take by mouth., Disp: , Rfl:    fexofenadine-pseudoephedrine (ALLEGRA-Carla 24) 180-240 MG 24 hr tablet, Take 1 tablet by mouth every evening. For allergy and congestion, Disp: 30 tablet, Rfl: 11   ibandronate (BONIVA) 150 MG tablet, TAKE 1 TABLET IN THE MORNING WITH A FULL GLASS OF WATER ON AN EMPTY STOMACH ONCE EVERY 30 DAYS-DO NOT LIE DOWN FOR 30 MIN, Disp: 12 tablet, Rfl: 1   Omega 3 1000 MG CAPS, Take by mouth., Disp: , Rfl:    polyethylene glycol (MIRALAX / GLYCOLAX) 17 g packet, Take 17 g by mouth daily.  Uses at least 4 x a week, Disp: , Rfl:    SUMAtriptan (IMITREX) 100 MG tablet, Take 1 tablet (100 mg total) by mouth every 2 (two) hours as needed for migraine. Do not exceed 2 tabs per 24 hours, Disp: 9 tablet, Rfl: 1 Social History   Socioeconomic History   Marital status: Married    Spouse name: Not on file   Number of children: Not on file   Years of education: Not on file   Highest education level: Not on file  Occupational History   Not on file  Tobacco Use   Smoking status: Former    Packs/day: 0.50    Types: Cigarettes    Start date: 12/18/1976    Quit date: 12/18/1986    Years since quitting: 34.7   Smokeless tobacco: Never  Vaping Use   Vaping Use: Never used  Substance and Sexual Activity   Alcohol use: Yes    Alcohol/week: 2.0 standard drinks    Types: 2 Glasses of wine per week   Drug use: No   Sexual activity: Not on file  Other Topics Concern   Not on file  Social History Narrative   Not on file   Social Determinants of Health   Financial Resource Strain: Not on file  Food Insecurity: Not on file  Transportation Needs: Not on file  Physical Activity: Not on  file  Stress: Not on file  Social Connections: Not on file  Intimate Partner Violence: Not on file   Family History  Problem Relation Age of Onset   Pneumonia Mother    Hyperlipidemia Paternal Grandmother        diet controlled   Migraines Neg Hx    Colon cancer Neg Hx    Colon polyps Neg Hx    Esophageal cancer Neg Hx    Rectal cancer Neg Hx    Stomach cancer Neg Hx     Objective: Office vital signs reviewed. BP 116/65   Pulse 73   Temp (!) 97.5 F (36.4 C) (Temporal)   Ht 5\' 3"  (1.6 m)   Wt 145 lb 3.2 oz (65.9 kg)   SpO2 97%   BMI 25.72 kg/m   Physical Examination:  General: Awake, alert, well nourished, No acute distress HEENT: Normal; PERRLA, EOMI Cardio: regular rate and rhythm, S1S2 heard, no murmurs appreciated Pulm: clear to auscultation bilaterally, no wheezes, rhonchi or  rales; normal work of breathing on room air MSK: Normal gait and station  Assessment/ Plan: 59 y.o. female   Migraine without aura and without status migrainosus, not intractable - Plan: Rimegepant Sulfate (NURTEC) 75 MG TBDP, SUMAtriptan (IMITREX) 100 MG tablet  Age-related osteoporosis without current pathological fracture - Plan: risedronate (ACTONEL) 150 MG tablet  Need for immunization against influenza - Plan: Flu Vaccine QUAD 32mo+IM (Fluarix, Fluzone & Alfiuria Quad PF)  Trial of Nurtec.  I renewed her Imitrex to have on hand just in case the Nurtec is ineffective.  We discussed use of Nurtec.  4 samples were provided.  She will contact me if she finds this to be helpful we will discontinue Imitrex  I have switched her from Boniva to Actonel since Oden has inferior data compared to Actonel.  She will replace her next dose of Boniva with Actonel.  Plan for repeat bone density scan 2 years following previous.  We will plan to collect calcium and vitamin Carla levels at her annual physical in March  Influenza vaccination administered  Orders Placed This Encounter  Procedures   Flu Vaccine QUAD 65mo+IM (Fluarix, Fluzone & Alfiuria Quad PF)   No orders of the defined types were placed in this encounter.    Janora Norlander, DO [place] [phone]

## 2021-12-07 ENCOUNTER — Encounter: Payer: Self-pay | Admitting: *Deleted

## 2022-01-20 ENCOUNTER — Other Ambulatory Visit: Payer: Self-pay | Admitting: Family Medicine

## 2022-01-20 DIAGNOSIS — B356 Tinea cruris: Secondary | ICD-10-CM

## 2022-02-24 ENCOUNTER — Encounter: Payer: 59 | Admitting: Family Medicine

## 2022-02-28 ENCOUNTER — Encounter: Payer: 59 | Admitting: Family Medicine

## 2022-05-02 ENCOUNTER — Encounter: Payer: 59 | Admitting: Family Medicine

## 2022-06-01 ENCOUNTER — Encounter: Payer: 59 | Admitting: Family Medicine

## 2022-08-22 ENCOUNTER — Telehealth: Payer: Self-pay | Admitting: Family Medicine

## 2022-08-22 DIAGNOSIS — E559 Vitamin D deficiency, unspecified: Secondary | ICD-10-CM

## 2022-08-22 DIAGNOSIS — M810 Age-related osteoporosis without current pathological fracture: Secondary | ICD-10-CM

## 2022-08-22 DIAGNOSIS — E7800 Pure hypercholesterolemia, unspecified: Secondary | ICD-10-CM

## 2022-08-22 NOTE — Telephone Encounter (Signed)
Orders are in

## 2022-08-22 NOTE — Telephone Encounter (Signed)
Patient aware.

## 2022-09-20 ENCOUNTER — Ambulatory Visit: Payer: Self-pay

## 2022-09-20 ENCOUNTER — Ambulatory Visit (INDEPENDENT_AMBULATORY_CARE_PROVIDER_SITE_OTHER): Payer: 59 | Admitting: Family Medicine

## 2022-09-20 ENCOUNTER — Ambulatory Visit (INDEPENDENT_AMBULATORY_CARE_PROVIDER_SITE_OTHER): Payer: 59

## 2022-09-20 VITALS — BP 122/84 | HR 78 | Ht 63.0 in | Wt 149.0 lb

## 2022-09-20 DIAGNOSIS — M79662 Pain in left lower leg: Secondary | ICD-10-CM | POA: Diagnosis not present

## 2022-09-20 MED ORDER — GABAPENTIN 100 MG PO CAPS: 100.0000 mg | ORAL_CAPSULE | Freq: Every day | ORAL | 3 refills | Status: DC

## 2022-09-20 NOTE — Progress Notes (Unsigned)
   I, Peterson Lombard, LAT, ATC acting as a scribe for Lynne Leader, MD.  Subjective:    CC: L lower leg pain  HPI: Pt is a 61 y/o female c/o L leg pain ongoing since the first of September. Pt thinks she may be done something, falling [place] the [place]. Pt does exercise classes x3/wk. Pt locates pain to posterior aspect of the L lower leg and into the posterior aspect of the L knee. [employer] comes to her work weekly and does stretching and treatments on the employees  L lower leg swelling: no Radiates: no Aggravates: aches at rest/night, down steps, prolonged sitting and then transitioning to stand Treatments tried: IBU, peppermint oil, exercise program  Pertinent review of Systems: no fever or chills  Relevant historical information: Hyperlipidemia.   Objective:    Vitals:   09/20/22 1357  BP: 122/84  Pulse: 78  SpO2: 99%   General: Well Developed, well nourished, and in no acute distress.   MSK: Left calf: Normal-appearing Tender palpation medial gastrocnemius muscle belly area. Normal foot and ankle motion. Normal knee motion. Pain with passive knee extension and foot dorsiflexion. No significant calf swelling or palpable cords. No calf erythema. Pulses cap refill and sensation are intact distally. Strength is intact distally.  L-spine: Nontender midline normal lumbar motion.  Normal lower extremity strength. Reflexes are intact. Positive left-sided slump test.  Lab and Radiology Results  Diagnostic Limited MSK Ultrasound of: Left calf No visible calf tears are present. Achilles tendon is intact. No Baker's cyst posterior knee Impression: Normal-appearing MSK ultrasound of the calf   X-ray images L-spine obtained today personally and independently interpreted DDD L5-S1.  Facet degenerative in this area.  Anterolisthesis L4-5. Await formal radiology review  Impression and Recommendations:    Assessment and Plan: 61 y.o. female with left calf pain along  posterior medial calf.  This is concerning for a calf strain or perhaps S1 radiculopathy.  Plan for exercise program taught in clinic today by ATC and compression sleeve.  Additionally will treat for potential radiculopathy with gabapentin.  Check back in 1 month.  If not improving consider further work-up.  PDMP not reviewed this encounter. Orders Placed This Encounter  Procedures   [place] LIMITED JOINT SPACE STRUCTURES LOW LEFT(NO LINKED CHARGES)    Order Specific Question:   Reason for Exam (SYMPTOM  OR DIAGNOSIS REQUIRED)    Answer:   left lower leg pain    Order Specific Question:   Preferred imaging location?    Answer:   [facility] Lumbar Spine 2-3 Views    Standing Status:   Future    Number of Occurrences:   1    Standing Expiration Date:   10/21/2022    Order Specific Question:   Reason for Exam (SYMPTOM  OR DIAGNOSIS REQUIRED)    Answer:   radicular pain    Order Specific Question:   Preferred imaging location?    Answer:   [facility]   Meds ordered this encounter  Medications   gabapentin (NEURONTIN) 100 MG capsule    Sig: Take 1 capsule (100 mg total) by mouth at bedtime.    Dispense:  30 capsule    Refill:  3    Discussed warning signs or symptoms. Please see discharge instructions. Patient expresses understanding.   The above documentation has been reviewed and is accurate and complete Lynne Leader, M.D.

## 2022-09-20 NOTE — Patient Instructions (Addendum)
Thank you for coming in today.   Please get an Xray today before you leave   I've sent a prescription for Gabapentin to your pharmacy.   Please complete the exercises that the athletic trainer went over with you:    Check back in 1 month

## 2022-09-25 NOTE — Progress Notes (Signed)
Lumbar spine x-ray shows arthritis changes in the low back.

## 2022-10-01 ENCOUNTER — Other Ambulatory Visit: Payer: Self-pay | Admitting: Family Medicine

## 2022-10-01 DIAGNOSIS — G43009 Migraine without aura, not intractable, without status migrainosus: Secondary | ICD-10-CM

## 2022-10-02 ENCOUNTER — Telehealth: Payer: Self-pay

## 2022-10-02 ENCOUNTER — Encounter: Payer: Self-pay | Admitting: Family Medicine

## 2022-10-02 NOTE — Telephone Encounter (Signed)
Spoke to pt and gave further explanation of XR results. Pt reported that she has been working on the HEP and is not seeing any change. Pt expressed confusion and frustration that she doesn't know/understand exactly what the source of her pain is. I offered a referral for physical therapy or to schedule a sooner f/u for further work up. Pt stated that she wanted to think about her options and would send a message later with her decision.

## 2022-10-04 ENCOUNTER — Other Ambulatory Visit: Payer: Self-pay

## 2022-10-04 DIAGNOSIS — M79662 Pain in left lower leg: Secondary | ICD-10-CM

## 2022-10-10 ENCOUNTER — Ambulatory Visit: Payer: 59 | Attending: Family Medicine | Admitting: Physical Therapy

## 2022-10-10 ENCOUNTER — Other Ambulatory Visit: Payer: Self-pay

## 2022-10-10 ENCOUNTER — Encounter: Payer: Self-pay | Admitting: Physical Therapy

## 2022-10-10 DIAGNOSIS — M79662 Pain in left lower leg: Secondary | ICD-10-CM | POA: Insufficient documentation

## 2022-10-10 NOTE — Therapy (Signed)
OUTPATIENT PHYSICAL THERAPY LOWER EXTREMITY EVALUATION   Patient Name: Carla Moreno MRN: [id number] DOB:1961-09-30, 61 y.o., female Today's Date: 10/10/2022   PT End of Session - 10/10/22 1820     Visit Number 1    Number of Visits 6    Date for PT Re-Evaluation 11/21/22    PT Start Time 0315    PT Stop Time 0412    PT Time Calculation (min) 57 min             Past Medical History:  Diagnosis Date   Allergy    enviromental   Diverticulitis of large intestine without perforation or abscess without bleeding 05/10/2021   Hx of adenomatous colonic polyps 08/25/2021   2 diminutive   Hyperlipidemia    Migraines    past hx   Osteoporosis    Past Surgical History:  Procedure Laterality Date   ABDOMINAL HYSTERECTOMY  12/19/1999   CESAREAN SECTION     2 occasions   COLONOSCOPY  09/18/2011   TUBAL LIGATION     prior to hysterectomy   Patient Active Problem List   Diagnosis Date Noted   Hx of adenomatous colonic polyps 08/25/2021   Sensorineural hearing loss (SNHL) of both ears 12/04/2018   Tinnitus, bilateral 12/04/2018   Hyperlipidemia 10/09/2016   Migraine 03/19/2015   Vitamin D deficiency 04/29/2014   Hx of migraines 04/03/2013    REFERRING PROVIDER: Lynne Leader MD  REFERRING DIAG: Pain in left lower leg.  THERAPY DIAG:  Pain in left lower leg  Rationale for Evaluation and Treatment Rehabilitation  ONSET DATE: 08/18/22.    SUBJECTIVE:   SUBJECTIVE STATEMENT: The patient presents to the clinic with c/o left lower leg pain rated at an 8/10 today.  She states it really became apparent after falling in the ocean on 08/18/22.  Prior to this she had also been working out quite hard including exercise such as the [place] twist.  She states that turning over in bed is very pain.  She has only mild low back pain and reports no history of significant back pain.  She has been using a compression sleeve on her left calf.  She describes the pain as an ache and throb.   Sitting in excess of 15 minutes also increases her pain.  PERTINENT HISTORY: OP. PAIN:  Are you having pain? Yes: NPRS scale: 8/10 Pain location: Left calf. Pain description: As above. Aggravating factors: As above. Relieving factors: Nothing.  PRECAUTIONS: Other: OP.  WEIGHT BEARING RESTRICTIONS: No.  FALLS:  Has patient fallen in last 6 months? Yes. Number of falls In ocean.  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  PLOF: Independent  PATIENT GOALS: Get out of pain and be able to exercise.   OBJECTIVE:   DIAGNOSTIC FINDINGS: 1. Moderate multilevel facet hypertrophy, most prominent at L4-L5 and L5-S1. Grade 1 anterolisthesis of L4 on L5. 2. Mild spondylosis with anterior spurring, preservation of disc spaces.  POSTURE: No Significant postural limitations  PALPATION: Some c/o pain over her Gastroc heads.  Mild c/o pain with overpressure at L5-S1.  LOWER EXTREMITY ROM: Normal left LE motion.  End-range left knee flexion reproduced her left lower leg symptoms. LOWER EXTREMITY MMT: Normal LE strength.  LOWER EXTREMITY SPECIAL TESTS:  (-) SLR testing.  Normal left knee stability testing.   TODAY'S TREATMENT  DATE: HMP and IFC at 80-150 Hz on 40% scan x 20 minutes to patient's left calf.  HEP:  Long hold S and DKTC stretch.  ASSESSMENT:  CLINICAL IMPRESSION: The patient presents to [facility] with c/o left lower leg pain.  She has been doing a pretty aggressive workout regimen and then a fall in the [place] on 08/18/22 began her pain.  She found to have palpable pain over her left Gastoc heads and mild pain over her L5-S1 region.  She demonstrated a negative left SLR test.  She has normal left knee stability though her pain was reproduced with endrange flexion.  Sitting more  tha 15 minutes and turning over in bed can produce severe pain.  Patient will benefit from skilled physical therapy intervention to address pain and deficits.  OBJECTIVE IMPAIRMENTS: decreased activity tolerance and pain.   ACTIVITY LIMITATIONS: sitting and bed mobility  PARTICIPATION LIMITATIONS:  Exercise.  REHAB POTENTIAL: Good  CLINICAL DECISION MAKING: Evolving/moderate complexity  EVALUATION COMPLEXITY: Moderate   GOALS: LONG TERM GOALS: Target date: 11/07/2022   Ind with a HEP. Baseline:  Goal status: INITIAL  2.  Perform ADL's with pain not > 2-3/10. Baseline:  Goal status: INITIAL  3.  Resume exercising. Baseline:  Goal status: INITIAL  4.  Sit 30 minutes with pain not > 2/10. Baseline:  Goal status: INITIAL     PLAN:  PT FREQUENCY: 1-2x/week  PT DURATION: 6 weeks  PLANNED INTERVENTIONS: Therapeutic exercises, Therapeutic activity, Patient/Family education, Self Care, Dry Needling, Electrical stimulation, Cryotherapy, Moist heat, Vasopneumatic device, Ultrasound, and Manual therapy  PLAN FOR NEXT SESSION: Combo e'stim/US, STW/M, core exercise progression, S and DKTC, hip bridges.   Huma Imhoff, [place], PT 10/10/2022, 6:21 PM

## 2022-10-12 ENCOUNTER — Ambulatory Visit: Payer: 59 | Admitting: *Deleted

## 2022-10-12 ENCOUNTER — Encounter: Payer: Self-pay | Admitting: *Deleted

## 2022-10-12 DIAGNOSIS — M79662 Pain in left lower leg: Secondary | ICD-10-CM | POA: Diagnosis not present

## 2022-10-12 NOTE — Therapy (Signed)
OUTPATIENT PHYSICAL THERAPY LOWER EXTREMITY TREATMENT   Patient Name: Carla Moreno MRN: [id number] DOB:10-Feb-1961, 61 y.o., female Today's Date: 10/12/2022   PT End of Session - 10/12/22 1557     Visit Number 2    Number of Visits 6    PT Start Time 1600    PT Stop Time 0370    PT Time Calculation (min) 50 min             Past Medical History:  Diagnosis Date   Allergy    enviromental   Diverticulitis of large intestine without perforation or abscess without bleeding 05/10/2021   Hx of adenomatous colonic polyps 08/25/2021   2 diminutive   Hyperlipidemia    Migraines    past hx   Osteoporosis    Past Surgical History:  Procedure Laterality Date   ABDOMINAL HYSTERECTOMY  12/19/1999   CESAREAN SECTION     2 occasions   COLONOSCOPY  09/18/2011   TUBAL LIGATION     prior to hysterectomy   Patient Active Problem List   Diagnosis Date Noted   Hx of adenomatous colonic polyps 08/25/2021   Sensorineural hearing loss (SNHL) of both ears 12/04/2018   Tinnitus, bilateral 12/04/2018   Hyperlipidemia 10/09/2016   Migraine 03/19/2015   Vitamin D deficiency 04/29/2014   Hx of migraines 04/03/2013    REFERRING PROVIDER: Lynne Leader MD  REFERRING DIAG: Pain in left lower leg.  THERAPY DIAG:  Pain in left lower leg  Rationale for Evaluation and Treatment Rehabilitation  ONSET DATE: 08/18/22.    SUBJECTIVE:   SUBJECTIVE STATEMENT: LT le PERTINENT HISTORY: OP. PAIN:  Are you having pain? Yes: NPRS scale: 7/10 Pain location: Left calf. Pain description: As above. Aggravating factors: As above. Relieving factors: Nothing.  PRECAUTIONS: Other: OP.  WEIGHT BEARING RESTRICTIONS: No.  FALLS:  Has patient fallen in last 6 months? Yes. Number of falls In ocean.  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  PLOF:  Independent  PATIENT GOALS: Get out of pain and be able to exercise.   OBJECTIVE:   DIAGNOSTIC FINDINGS: 1. Moderate multilevel facet hypertrophy, most prominent at L4-L5 and L5-S1. Grade 1 anterolisthesis of L4 on L5. 2. Mild spondylosis with anterior spurring, preservation of disc spaces.  POSTURE: No Significant postural limitations  PALPATION: Some c/o pain over her Gastroc heads.  Mild c/o pain with overpressure at L5-S1.  LOWER E                            Normal left LE motion.  End-range left knee flexion reproduced her left lower leg symptoms LOWER EXTREMITY MMT: Normal LE strength.  LOWER EXTREMITY SPECIAL TESTS:  (-) SLR testing.  Normal left knee stability testing.   TODAY'S TREATMENT                                                                          DATE:10-12-22            EXERCISE LOG  Exercise Repetitions and Resistance Comments  Bike X 10 mins L3  Rocker board X5 hold 30 secs   SKTC X3 hold 15 secs   DKTC X5 hold 30 secs hands under knees   Bridging X10     Blank cell = exercise not performed today Manual:   STW to LT side glute and SIJ with Pt in RT side lying  [place] combo 1.5 w/cm2 x 10 mins to LT side SIJ area with Pt RT side lying        HEP:  Long hold S and DKTC stretch.  ASSESSMENT:  CLINICAL IMPRESSION: Pt arrived today doing fairly well with less pain in LT LB and LE. Pt was able to perform core exs as well as LE act.'s without increased pain. STW and [place] combo were tolerated well with decreased pain end of session.  OBJECTIVE IMPAIRMENTS: decreased activity tolerance and pain.   ACTIVITY LIMITATIONS: sitting and bed mobility  PARTICIPATION LIMITATIONS:  Exercise.  REHAB POTENTIAL: Good  CLINICAL DECISION MAKING: Evolving/moderate complexity  EVALUATION COMPLEXITY: Moderate   GOALS: LONG TERM GOALS: Target date: 11/09/2022   Ind with a HEP. Baseline:  Goal status: INITIAL  2.  Perform ADL's with pain not >  2-3/10. Baseline:  Goal status: INITIAL  3.  Resume exercising. Baseline:  Goal status: INITIAL  4.  Sit 30 minutes with pain not > 2/10. Baseline:  Goal status: INITIAL     PLAN:  PT FREQUENCY: 1-2x/week  PT DURATION: 6 weeks  PLANNED INTERVENTIONS: Therapeutic exercises, Therapeutic activity, Patient/Family education, Self Care, Dry Needling, Electrical stimulation, Cryotherapy, Moist heat, Vasopneumatic device, Ultrasound, and Manual therapy  PLAN FOR NEXT SESSION: Combo e'stim/US, STW/M, core exercise progression, S and DKTC, hip bridges.Bike   Edye Hainline,CHRIS, PTA 10/12/2022, 5:17 PM

## 2022-10-12 NOTE — Telephone Encounter (Signed)
error 

## 2022-10-13 ENCOUNTER — Other Ambulatory Visit: Payer: 59

## 2022-10-13 DIAGNOSIS — E7800 Pure hypercholesterolemia, unspecified: Secondary | ICD-10-CM

## 2022-10-13 DIAGNOSIS — E559 Vitamin D deficiency, unspecified: Secondary | ICD-10-CM

## 2022-10-13 DIAGNOSIS — M810 Age-related osteoporosis without current pathological fracture: Secondary | ICD-10-CM

## 2022-10-14 LAB — VITAMIN D 25 HYDROXY (VIT D DEFICIENCY, FRACTURES): Vit D, 25-Hydroxy: 56.8 ng/mL (ref 30.0–100.0)

## 2022-10-14 LAB — LIPID PANEL
Chol/HDL Ratio: 4.1 ratio (ref 0.0–4.4)
Cholesterol, Total: 220 mg/dL — ABNORMAL HIGH (ref 100–199)
HDL: 54 mg/dL (ref >39)
LDL Chol Calc (NIH): 153 mg/dL — ABNORMAL HIGH (ref 0–99)
Triglycerides: 74 mg/dL (ref 0–149)
VLDL Cholesterol Cal: 13 mg/dL (ref 5–40)

## 2022-10-14 LAB — CMP14+EGFR
ALT: 20 IU/L (ref 0–32)
AST: 22 IU/L (ref 0–40)
Albumin/Globulin Ratio: 1.8 (ref 1.2–2.2)
Albumin: 4.7 g/dL (ref 3.9–4.9)
Alkaline Phosphatase: 62 IU/L (ref 44–121)
BUN/Creatinine Ratio: 20 (ref 12–28)
BUN: 14 mg/dL (ref 8–27)
Bilirubin Total: 0.7 mg/dL (ref 0.0–1.2)
CO2: 24 mmol/L (ref 20–29)
Calcium: 10 mg/dL (ref 8.7–10.3)
Chloride: 102 mmol/L (ref 96–106)
Creatinine, Ser: 0.71 mg/dL (ref 0.57–1.00)
Globulin, Total: 2.6 g/dL (ref 1.5–4.5)
Glucose: 87 mg/dL (ref 70–99)
Potassium: 4.3 mmol/L (ref 3.5–5.2)
Sodium: 141 mmol/L (ref 134–144)
Total Protein: 7.3 g/dL (ref 6.0–8.5)
eGFR: 97 mL/min/{1.73_m2} (ref >59)

## 2022-10-14 LAB — CBC
Hematocrit: 39.3 % (ref 34.0–46.6)
Hemoglobin: 13.3 g/dL (ref 11.1–15.9)
MCH: 31.4 pg (ref 26.6–33.0)
MCHC: 33.8 g/dL (ref 31.5–35.7)
MCV: 93 fL (ref 79–97)
Platelets: 235 10*3/uL (ref 150–450)
RBC: 4.24 x10E6/uL (ref 3.77–5.28)
RDW: 12.4 % (ref 11.7–15.4)
WBC: 6.4 10*3/uL (ref 3.4–10.8)

## 2022-10-14 LAB — TSH: TSH: 2.1 u[IU]/mL (ref 0.450–4.500)

## 2022-10-17 ENCOUNTER — Ambulatory Visit (INDEPENDENT_AMBULATORY_CARE_PROVIDER_SITE_OTHER): Payer: 59 | Admitting: Family Medicine

## 2022-10-17 ENCOUNTER — Ambulatory Visit: Payer: 59 | Admitting: *Deleted

## 2022-10-17 ENCOUNTER — Ambulatory Visit (INDEPENDENT_AMBULATORY_CARE_PROVIDER_SITE_OTHER): Payer: 59

## 2022-10-17 ENCOUNTER — Encounter: Payer: Self-pay | Admitting: Family Medicine

## 2022-10-17 VITALS — BP 114/64 | HR 80 | Temp 98.1°F | Ht 63.0 in | Wt 148.0 lb

## 2022-10-17 DIAGNOSIS — E7800 Pure hypercholesterolemia, unspecified: Secondary | ICD-10-CM | POA: Diagnosis not present

## 2022-10-17 DIAGNOSIS — M810 Age-related osteoporosis without current pathological fracture: Secondary | ICD-10-CM

## 2022-10-17 DIAGNOSIS — Z0001 Encounter for general adult medical examination with abnormal findings: Secondary | ICD-10-CM | POA: Diagnosis not present

## 2022-10-17 DIAGNOSIS — M79662 Pain in left lower leg: Secondary | ICD-10-CM

## 2022-10-17 DIAGNOSIS — Z0000 Encounter for general adult medical examination without abnormal findings: Secondary | ICD-10-CM

## 2022-10-17 DIAGNOSIS — E559 Vitamin D deficiency, unspecified: Secondary | ICD-10-CM

## 2022-10-17 MED ORDER — EZETIMIBE 10 MG PO TABS: 10.0000 mg | ORAL_TABLET | Freq: Every day | ORAL | 3 refills | Status: DC

## 2022-10-17 NOTE — Therapy (Signed)
OUTPATIENT PHYSICAL THERAPY LOWER EXTREMITY TREATMENT   Patient Name: Carla Moreno MRN: [id number] DOB:1961/06/06, 61 y.o., female Today's Date: 10/17/2022   PT End of Session - 10/17/22 1518     Visit Number 3    Number of Visits 6    Date for PT Re-Evaluation 11/21/22    PT Start Time 4825    PT Stop Time 1605    PT Time Calculation (min) 50 min             Past Medical History:  Diagnosis Date   Allergy    enviromental   Diverticulitis of large intestine without perforation or abscess without bleeding 05/10/2021   Hx of adenomatous colonic polyps 08/25/2021   2 diminutive   Hyperlipidemia    Migraines    past hx   Osteoporosis    Past Surgical History:  Procedure Laterality Date   ABDOMINAL HYSTERECTOMY  12/19/1999   CESAREAN SECTION     2 occasions   COLONOSCOPY  09/18/2011   TUBAL LIGATION     prior to hysterectomy   Patient Active Problem List   Diagnosis Date Noted   Hx of adenomatous colonic polyps 08/25/2021   Sensorineural hearing loss (SNHL) of both ears 12/04/2018   Tinnitus, bilateral 12/04/2018   Hyperlipidemia 10/09/2016   Migraine 03/19/2015   Vitamin D deficiency 04/29/2014   Hx of migraines 04/03/2013    REFERRING PROVIDER: Lynne Leader MD  REFERRING DIAG: Pain in left lower leg.  THERAPY DIAG:  Pain in left lower leg  Rationale for Evaluation and Treatment Rehabilitation  ONSET DATE: 08/18/22.    SUBJECTIVE:   SUBJECTIVE STATEMENT:   Went to MD and had an injection in LT knee and it is doing better with minimal pain. LBP PERTINENT HISTORY: OP. PAIN:  Are you having pain? Yes: NPRS scale: 2-3/10 Pain location: Left calf. Pain description: As above. Aggravating factors: As above. Relieving factors: Nothing.  PRECAUTIONS: Other: OP.  WEIGHT BEARING RESTRICTIONS: No.  FALLS:  Has patient fallen in last 6 months? Yes. Number of falls In ocean.  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  PLOF: Independent  PATIENT GOALS: Get out of pain and be able to exercise.   OBJECTIVE:   DIAGNOSTIC FINDINGS: 1. Moderate multilevel facet hypertrophy, most prominent at L4-L5 and L5-S1. Grade 1 anterolisthesis of L4 on L5. 2. Mild spondylosis with anterior spurring, preservation of disc spaces.  POSTURE: No Significant postural limitations  PALPATION: Some c/o pain over her Gastroc heads.  Mild c/o pain with overpressure at L5-S1.  LOWER E                            Normal left LE motion.  End-range left knee flexion reproduced her left lower leg symptoms LOWER EXTREMITY MMT: Normal LE strength.  LOWER EXTREMITY SPECIAL TESTS:  (-) SLR testing.  Normal left knee stability testing.   TODAY'S TREATMENT                             DATE:10-17-22            EXERCISE LOG  Exercise Repetitions and Resistance Comments  Bike X 12 mins L2   Rocker board X5 hold 30 secs   SKTC X3 hold 15 secs   DKTC X5 hold  30 secs hands under knees   Bridging X10    AB brace with SLR LT and RT LE 2x10-15 each   Dying bug 2x10 hold 5 secs Bridge x 10 hold 5-10 secs    Blank cell = exercise not performed today Manual:     [place] combo 1.5 w/cm2 x 10 mins to LT/RT side SIJ area with Pt RT side lying        HEP:  Handout given for Dying bug and bridging.  ASSESSMENT:  CLINICAL IMPRESSION: Pt arrived today doing fairly well with less pain in LB and LE. She also reports seeing MD and having an injection in LT knee and is doing a lot better.Pt was able to perform core exs and progress with dying bug and bridging and did well. Light LT LE exs performed  without increaed pain. [place] combo were tolerated well with decreased pain end of session.  OBJECTIVE IMPAIRMENTS: decreased activity tolerance and pain.   ACTIVITY LIMITATIONS: sitting and bed mobility  PARTICIPATION LIMITATIONS:   Exercise.  REHAB POTENTIAL: Good  CLINICAL DECISION MAKING: Evolving/moderate complexity  EVALUATION COMPLEXITY: Moderate   GOALS: LONG TERM GOALS: Target date: 11/14/2022   Ind with a HEP. Baseline:  Goal status: INITIAL  2.  Perform ADL's with pain not > 2-3/10. Baseline:  Goal status: INITIAL  3.  Resume exercising. Baseline:  Goal status: INITIAL  4.  Sit 30 minutes with pain not > 2/10. Baseline:  Goal status: INITIAL     PLAN:  PT FREQUENCY: 1-2x/week  PT DURATION: 6 weeks  PLANNED INTERVENTIONS: Therapeutic exercises, Therapeutic activity, Patient/Family education, Self Care, Dry Needling, Electrical stimulation, Cryotherapy, Moist heat, Vasopneumatic device, Ultrasound, and Manual therapy  PLAN FOR NEXT SESSION: Combo e'stim/US, STW/M, core exercise progression, S and DKTC, hip bridges.Bike   Guinevere Stephenson,CHRIS, PTA 10/17/2022, 5:21 PM

## 2022-10-17 NOTE — Progress Notes (Unsigned)
Carla Moreno is a 61 y.o. female presents to office today for annual physical exam examination.    Concerns today include: 1.  Hyperlipidemia/ knee pain Patient reports that she really does try to eat a very balanced diet.  She has not been exercising regularly as of late because she has been dealing with some left knee pain and she is under the care of orthopedics for that now.  She had a corticosteroid injection administered yesterday and will be following up via telephone in 2 weeks to determine if she needs to go through with MRI of her knee and back.  Currently taking oral NSAIDs but apparently there is been a new prescription that has been sent in and she will contact me with what that is.  Gabapentin had not been helpful but she was only prescribed this 100 mg per night.  She is not willing to do a statin but is willing to proceed with a nonstatin for treatment of hyperlipidemia.  No reports of chest pain.  2.  Osteoporosis The patient has been out of her bisphosphonate for about 3 months now.  She just neglected to call refills in but she is actually very interested in pursuing injection therapy instead.  Apparently her insurance really did not want to cover the Prolia before but she would like to see if we can resubmit this and perhaps get it covered now.  Does not report any bony pain.  She eats again a balanced diet and tries to exercise.  Substance use: 1-2 glasses of dry wine Diet: balanced, Exercise: regular Last eye exam: UTD Last dental exam: UTD Last colonoscopy: UTD Last mammogram: UTD, gets with Dr Terri Piedra Last pap smear: UTD Refills needed today: none Immunizations needed: Immunization History  Administered Date(s) Administered   Influenza Inj Mdck Quad With Preservative 09/29/2019   Influenza Split 09/22/2013   Influenza,inj,Quad PF,6+ Mos 10/06/2014, 09/27/2015, 09/18/2016, 10/01/2017, 10/01/2018, 09/16/2021   Influenza-Unspecified 10/04/2020   Moderna  Sars-Covid-2 Vaccination 02/20/2020, 03/24/2020, 12/02/2020   Tdap 04/29/2014, 02/10/2018     Past Medical History:  Diagnosis Date   Allergy    enviromental   Diverticulitis of large intestine without perforation or abscess without bleeding 05/10/2021   Hx of adenomatous colonic polyps 08/25/2021   2 diminutive   Hyperlipidemia    Migraines    past hx   Osteoporosis    Social History   Socioeconomic History   Marital status: Married    Spouse name: Not on file   Number of children: Not on file   Years of education: Not on file   Highest education level: Not on file  Occupational History   Not on file  Tobacco Use   Smoking status: Former    Packs/day: 0.50    Types: Cigarettes    Start date: 12/18/1976    Quit date: 12/18/1986    Years since quitting: 35.8   Smokeless tobacco: Never  Vaping Use   Vaping Use: Never used  Substance and Sexual Activity   Alcohol use: Yes    Alcohol/week: 2.0 standard drinks of alcohol    Types: 2 Glasses of wine per week   Drug use: No   Sexual activity: Not on file  Other Topics Concern   Not on file  Social History Narrative   Not on file   Social Determinants of Health   Financial Resource Strain: Not on file  Food Insecurity: Not on file  Transportation Needs: Not on file  Physical Activity: Not  on file  Stress: Not on file  Social Connections: Not on file  Intimate Partner Violence: Not on file   Past Surgical History:  Procedure Laterality Date   ABDOMINAL HYSTERECTOMY  12/19/1999   CESAREAN SECTION     2 occasions   COLONOSCOPY  09/18/2011   TUBAL LIGATION     prior to hysterectomy   Family History  Problem Relation Age of Onset   Pneumonia Mother    Hyperlipidemia Paternal Grandmother        diet controlled   Migraines Neg Hx    Colon cancer Neg Hx    Colon polyps Neg Hx    Esophageal cancer Neg Hx    Rectal cancer Neg Hx    Stomach cancer Neg Hx     Current Outpatient Medications:    CALCIUM PO, Take  by mouth., Disp: , Rfl:    cholecalciferol (VITAMIN D3) 25 MCG (1000 UNIT) tablet, Take 1,000 Units by mouth daily., Disp: , Rfl:    Cyanocobalamin (VITAMIN B-12 PO), Take by mouth., Disp: , Rfl:    fexofenadine-pseudoephedrine (ALLEGRA-D 24) 180-240 MG 24 hr tablet, Take 1 tablet by mouth every evening. For allergy and congestion, Disp: 30 tablet, Rfl: 11   gabapentin (NEURONTIN) 100 MG capsule, Take 1 capsule (100 mg total) by mouth at bedtime., Disp: 30 capsule, Rfl: 3   Omega 3 1000 MG CAPS, Take by mouth., Disp: , Rfl:    polyethylene glycol (MIRALAX / GLYCOLAX) 17 g packet, Take 17 g by mouth daily. Uses at least 4 x a week, Disp: , Rfl:    SUMAtriptan (IMITREX) 100 MG tablet, TAKE 0.5-1 TABLETS EVERY 2 (TWO) HOURS AS NEEDED FOR MIGRAINE. DO NOT EXCEED 2 TABS PER 24 HOURS, Disp: 9 tablet, Rfl: 0  No Known Allergies   ROS: Review of Systems Pertinent items noted in HPI and remainder of comprehensive ROS otherwise negative.    Physical exam BP 114/64   Pulse 80   Temp 98.1 F (36.7 C)   Ht '5\' 3"'$  (1.6 m)   Wt 148 lb (67.1 kg)   SpO2 100%   BMI 26.22 kg/m  General appearance: alert, cooperative, appears stated age, and no distress Head: Normocephalic, without obvious abnormality, atraumatic Eyes: conjunctivae/corneas clear. PERRL, EOM's intact. Fundi benign., wears glasses Ears: normal TM's and external ear canals both ears Nose: Nares normal. Septum midline. Mucosa normal. No drainage or sinus tenderness. Throat: lips, mucosa, and tongue normal; teeth and gums normal Neck: no adenopathy, supple, symmetrical, trachea midline, and thyroid not enlarged, symmetric, no tenderness/mass/nodules Back: symmetric, no curvature. ROM normal. No CVA tenderness. Lungs: clear to auscultation bilaterally Heart: regular rate and rhythm, S1, S2 normal, no murmur, click, rub or gallop Abdomen: soft, non-tender; bowel sounds normal; no masses,  no organomegaly Extremities: extremities normal,  atraumatic, no cyanosis or edema Pulses: 2+ and symmetric Skin: Skin color, texture, turgor normal. No rashes or lesions Lymph nodes: Cervical, supraclavicular, and axillary nodes normal. Neurologic: Grossly normal MSK: Gait is normal.  Flowsheet Row Office Visit from 10/17/2022 in [facility]  PHQ-2 Total Score 0      Assessment/ Plan: Carla Moreno here for annual physical exam.   Annual physical exam  Age-related osteoporosis without current pathological fracture - Plan: DG [facility] DEXA  Vitamin D deficiency - Plan: DG [facility] DEXA  Pure hypercholesterolemia - Plan: ezetimibe (ZETIA) 10 MG tablet, Lipid panel, Hepatic function panel  DEXA scan ordered.  Will reach out to Prolia pool to  see if this order can be resubmitted.  We reviewed her labs.  Start Zetia.  We will plan for fasting lipid and repeat liver function test in 3 months   Marco Raper M. Lajuana Ripple, DO

## 2022-10-19 ENCOUNTER — Encounter: Payer: 59 | Admitting: *Deleted

## 2022-10-20 ENCOUNTER — Other Ambulatory Visit: Payer: 59

## 2022-10-24 ENCOUNTER — Encounter: Payer: Self-pay | Admitting: *Deleted

## 2022-10-24 ENCOUNTER — Ambulatory Visit: Payer: 59 | Attending: Family Medicine | Admitting: *Deleted

## 2022-10-24 DIAGNOSIS — M79662 Pain in left lower leg: Secondary | ICD-10-CM | POA: Insufficient documentation

## 2022-10-24 NOTE — Therapy (Signed)
OUTPATIENT PHYSICAL THERAPY LOWER EXTREMITY TREATMENT   Patient Name: Carla Moreno MRN: [id number] DOB:12-Feb-1961, 61 y.o., female Today's Date: 10/24/2022   PT End of Session - 10/24/22 1556     Visit Number 4    Number of Visits 6    Date for PT Re-Evaluation 11/21/22    PT Start Time 1600             Past Medical History:  Diagnosis Date   Allergy    enviromental   Diverticulitis of large intestine without perforation or abscess without bleeding 05/10/2021   Hx of adenomatous colonic polyps 08/25/2021   2 diminutive   Hyperlipidemia    Migraines    past hx   Osteoporosis    Past Surgical History:  Procedure Laterality Date   ABDOMINAL HYSTERECTOMY  12/19/1999   CESAREAN SECTION     2 occasions   COLONOSCOPY  09/18/2011   TUBAL LIGATION     prior to hysterectomy   Patient Active Problem List   Diagnosis Date Noted   Hx of adenomatous colonic polyps 08/25/2021   Sensorineural hearing loss (SNHL) of both ears 12/04/2018   Tinnitus, bilateral 12/04/2018   Hyperlipidemia 10/09/2016   Migraine 03/19/2015   Vitamin D deficiency 04/29/2014   Hx of migraines 04/03/2013    REFERRING PROVIDER: Lynne Leader MD  REFERRING DIAG: Pain in left lower leg.  THERAPY DIAG:  Pain in left lower leg  Rationale for Evaluation and Treatment Rehabilitation  ONSET DATE: 08/18/22.    SUBJECTIVE:   SUBJECTIVE STATEMENT:  Still doing good with no LT knee pain and 2/10 LBP PERTINENT HISTORY: OP. PAIN:  Are you having pain? Yes: NPRS scale: 0/10 Pain location: Left calf. Pain description: As above. Aggravating factors: As above. Relieving factors: Nothing.  PRECAUTIONS: Other: OP.  WEIGHT BEARING RESTRICTIONS: No.  FALLS:  Has patient fallen in last 6 months? Yes. [place] falls [place].  LIVING ENVIRONMENT: Lives with: lives with their spouse Lives in: House/apartment Has following equipment at home: None  OCCUPATION: [employer].  She has good lumbar  support.  PLOF: Independent  PATIENT GOALS: Get out of pain and be able to exercise.   OBJECTIVE:   DIAGNOSTIC FINDINGS: 1. Moderate multilevel facet hypertrophy, most prominent at L4-L5 and L5-S1. Grade 1 anterolisthesis of L4 on L5. 2. Mild spondylosis with anterior spurring, preservation of disc spaces.  POSTURE: No Significant postural limitations  PALPATION: Some c/o pain over her Gastroc heads.  Mild c/o pain with overpressure at L5-S1.  LOWER E                            Normal left LE motion.  End-range left knee flexion reproduced her left lower leg symptoms LOWER EXTREMITY MMT: Normal LE strength.  LOWER EXTREMITY SPECIAL TESTS:  (-) SLR testing.  Normal left knee stability testing.   TODAY'S TREATMENT                             DATE:10-24-22            EXERCISE LOG  Exercise Repetitions and Resistance Comments  Bike X 13 mins L2   Rocker board X5 hold 30 secs   SKTC X3 hold 15 secs   DKTC X5 hold 30 secs hands under knees   Bridging X10 hold 10 secs   AB brace with SLR LT and RT LE 2x10-15 each  Dying bug 2x10 hold 5 secs     Blank cell = exercise not performed today Manual:     [place] combo 1.5 w/cm2 x 10 mins to LT/RT side LB and SIJ area with Pt RT side lying        HEP:  Handout given for Dying bug and bridging.  ASSESSMENT:  CLINICAL IMPRESSION: Pt arrived today doing fairly well with Lt  knee with no pain and LBP 2/10. She was able to continue with ex regimen for LT LE and core and did well without any flare-ups. Majority of LTGs partially met. Decreased pain with ADL's now     OBJECTIVE IMPAIRMENTS: decreased activity tolerance and pain.   ACTIVITY LIMITATIONS: sitting and bed mobility  PARTICIPATION LIMITATIONS:  Exercise.  REHAB POTENTIAL: Good  CLINICAL DECISION MAKING: Evolving/moderate complexity  EVALUATION COMPLEXITY: Moderate   GOALS: LONG TERM GOALS: Target date: 11/21/2022   Ind with a HEP. Baseline:  Goal  status: partially met  2.  Perform ADL's with pain not > 2-3/10. Baseline:  Goal status: On going  3.  Resume exercising. Baseline:  Goal status: Partially met  4.  Sit 30 minutes with pain not > 2/10. Baseline:  Goal status: Partially met     PLAN:  PT FREQUENCY: 1-2x/week  PT DURATION: 6 weeks  PLANNED INTERVENTIONS: Therapeutic exercises, Therapeutic activity, Patient/Family education, Self Care, Dry Needling, Electrical stimulation, Cryotherapy, Moist heat, Vasopneumatic device, Ultrasound, and Manual therapy  PLAN FOR NEXT SESSION: Combo e'stim/US, STW/M, core exercise progression, S and DKTC, hip bridges.Bike   Jayion Schneck,CHRIS, PTA 10/24/2022, 5:14 PM

## 2022-10-25 ENCOUNTER — Ambulatory Visit: Payer: 59 | Admitting: Family Medicine

## 2022-10-31 ENCOUNTER — Ambulatory Visit: Payer: 59 | Admitting: *Deleted

## 2022-10-31 ENCOUNTER — Other Ambulatory Visit: Payer: Self-pay | Admitting: Family Medicine

## 2022-10-31 ENCOUNTER — Encounter: Payer: Self-pay | Admitting: *Deleted

## 2022-10-31 DIAGNOSIS — M79662 Pain in left lower leg: Secondary | ICD-10-CM

## 2022-10-31 DIAGNOSIS — G43009 Migraine without aura, not intractable, without status migrainosus: Secondary | ICD-10-CM

## 2022-10-31 NOTE — Therapy (Signed)
OUTPATIENT PHYSICAL THERAPY LOWER EXTREMITY TREATMENT   Patient Name: Carla Moreno MRN: [id number] DOB:[birth year], 61 y.o., female Today's Date: 10/31/2022   PT End of Session - 10/31/22 1610     Visit Number 5    Number of Visits 6    Date for PT Re-Evaluation 11/21/22    PT Start Time 1600    PT Stop Time 1645    PT Time Calculation (min) 45 min             Past Medical History:  Diagnosis Date   Allergy    enviromental   Diverticulitis of large intestine without perforation or abscess without bleeding 05/10/2021   Hx of adenomatous colonic polyps 08/25/2021   2 diminutive   Hyperlipidemia    Migraines    past hx   Osteoporosis    Past Surgical History:  Procedure Laterality Date   ABDOMINAL HYSTERECTOMY  12/19/1999   CESAREAN SECTION     2 occasions   COLONOSCOPY  09/18/2011   TUBAL LIGATION     prior to hysterectomy   Patient Active Problem List   Diagnosis Date Noted   Hx of adenomatous colonic polyps 08/25/2021   Sensorineural hearing loss (SNHL) of both ears 12/04/2018   Tinnitus, bilateral 12/04/2018   Hyperlipidemia 10/09/2016   Migraine 03/19/2015   Vitamin D deficiency 04/29/2014   Hx of migraines 04/03/2013    REFERRING PROVIDER: Lynne Leader MD  REFERRING DIAG: Pain in left lower leg.  THERAPY DIAG:  Pain in left lower leg  Rationale for Evaluation and Treatment Rehabilitation  ONSET DATE: 08/18/22.    SUBJECTIVE:   SUBJECTIVE STATEMENT:  Still doing good with no LT knee pain and 2/10 LBP PERTINENT HISTORY: OP. PAIN:  Are you having pain? Yes: NPRS scale: 0/10 Pain location: Left calf. Pain description: As above. Aggravating factors: As above. Relieving factors: Nothing.  PRECAUTIONS: Other: OP.  WEIGHT BEARING RESTRICTIONS: No.  FALLS:  Has patient fallen in last 6 months? Yes. [place] of falls [place].  LIVING ENVIRONMENT: Lives with: lives with their spouse Lives in: House/apartment Has following equipment at  home: None  OCCUPATION: [employer].  She has good lumbar support.  PLOF: Independent  PATIENT GOALS: Get out of pain and be able to exercise.   OBJECTIVE:   DIAGNOSTIC FINDINGS: 1. Moderate multilevel facet hypertrophy, most prominent at L4-L5 and L5-S1. Grade 1 anterolisthesis of L4 on L5. 2. Mild spondylosis with anterior spurring, preservation of disc spaces.  POSTURE: No Significant postural limitations  PALPATION: Some c/o pain over her Gastroc heads.  Mild c/o pain with overpressure at L5-S1.  LOWER E                            Normal left LE motion.  End-range left knee flexion reproduced her left lower leg symptoms LOWER EXTREMITY MMT: Normal LE strength.  LOWER EXTREMITY SPECIAL TESTS:  (-) SLR testing.  Normal left knee stability testing.   TODAY'S TREATMENT                             DATE:10-31-22            EXERCISE LOG  Exercise Repetitions and Resistance Comments  Nustep X 10 mins L3   Rocker board    SKTC    DKTC    Bridging X10 hold 10 secs   AB brace with SLR  Dying bug 2x10 hold 5-10 secs  Verbal and tactile cues for technique  Standing Bird-dog  Arm raise x 3 hold 5 secs, Arm and leg raise x 6 each side hold 5-10 secs    Blank cell = exercise not performed today Manual:     [place] combo 1.5 w/cm2 x 14 mins to LT/RT side LB and SIJ area with Pt RT side lying        HEP:  Handout given for Dying bug and bridging.  ASSESSMENT:  CLINICAL IMPRESSION: Pt arrived today doing fairly well with Lt  knee with no pain , but reports having increased LT knee soreness after walking for 20 mins up and down hill. LBP 2/10.  Rx focused more on core exs  more today. Standing bird-dog performed today and did well. DC after next RX.     OBJECTIVE IMPAIRMENTS: decreased activity tolerance and pain.   ACTIVITY LIMITATIONS: sitting and bed mobility  PARTICIPATION LIMITATIONS:  Exercise.  REHAB POTENTIAL: Good  CLINICAL DECISION MAKING: Evolving/moderate  complexity  EVALUATION COMPLEXITY: Moderate   GOALS: LONG TERM GOALS: Target date: 11/28/2022   Ind with a HEP. Baseline:  Goal status: partially met  2.  Perform ADL's with pain not > 2-3/10. Baseline:  Goal status: On going  3.  Resume exercising. Baseline:  Goal status: Partially met  4.  Sit 30 minutes with pain not > 2/10. Baseline:  Goal status: Partially met     PLAN:  PT FREQUENCY: 1-2x/week  PT DURATION: 6 weeks  PLANNED INTERVENTIONS: Therapeutic exercises, Therapeutic activity, Patient/Family education, Self Care, Dry Needling, Electrical stimulation, Cryotherapy, Moist heat, Vasopneumatic device, Ultrasound, and Manual therapy  PLAN FOR NEXT SESSION: Combo e'stim/US, STW/M, core exercise progression, S and DKTC, hip bridges.Bike   Doren Kaspar,CHRIS, PTA 10/31/2022, 5:12 PM

## 2022-11-02 ENCOUNTER — Encounter: Payer: Self-pay | Admitting: *Deleted

## 2022-11-02 ENCOUNTER — Ambulatory Visit: Payer: 59 | Admitting: *Deleted

## 2022-11-02 DIAGNOSIS — M79662 Pain in left lower leg: Secondary | ICD-10-CM

## 2022-11-02 NOTE — Therapy (Signed)
OUTPATIENT PHYSICAL THERAPY LOWER EXTREMITY TREATMENT   Patient Name: Carla Moreno MRN: [id number] DOB:[birth year], 61 y.o., female Today's Date: 11/02/2022   PT End of Session - 11/02/22 1608     Visit Number 6    Number of Visits 6    Date for PT Re-Evaluation 11/21/22    PT Start Time 1600    PT Stop Time 8657    PT Time Calculation (min) 50 min             Past Medical History:  Diagnosis Date   Allergy    enviromental   Diverticulitis of large intestine without perforation or abscess without bleeding 05/10/2021   Hx of adenomatous colonic polyps 08/25/2021   2 diminutive   Hyperlipidemia    Migraines    past hx   Osteoporosis    Past Surgical History:  Procedure Laterality Date   ABDOMINAL HYSTERECTOMY  12/19/1999   CESAREAN SECTION     2 occasions   COLONOSCOPY  09/18/2011   TUBAL LIGATION     prior to hysterectomy   Patient Active Problem List   Diagnosis Date Noted   Hx of adenomatous colonic polyps 08/25/2021   Sensorineural hearing loss (SNHL) of both ears 12/04/2018   Tinnitus, bilateral 12/04/2018   Hyperlipidemia 10/09/2016   Migraine 03/19/2015   Vitamin D deficiency 04/29/2014   Hx of migraines 04/03/2013    REFERRING PROVIDER: Lynne Leader MD  REFERRING DIAG: Pain in left lower leg.  THERAPY DIAG:  Pain in left lower leg  Rationale for Evaluation and Treatment Rehabilitation  ONSET DATE: 08/18/22.    SUBJECTIVE:   SUBJECTIVE STATEMENT:  Still doing good with no RT knee. LBP 2/10 PERTINENT HISTORY: OP. PAIN:  Are you having pain? Yes: NPRS scale: 2/10 Pain location: Left calf. Pain description: As above. Aggravating factors: As above. Relieving factors: Nothing.  PRECAUTIONS: Other: OP.  WEIGHT BEARING RESTRICTIONS: No.  FALLS:  Has patient fallen in last 6 months? Yes. [place] falls [place].  LIVING ENVIRONMENT: Lives with: lives with their spouse Lives in: House/apartment Has following equipment at home:  None  OCCUPATION: [employer].  She has good lumbar support.  PLOF: Independent  PATIENT GOALS: Get out of pain and be able to exercise.   OBJECTIVE:   DIAGNOSTIC FINDINGS: 1. Moderate multilevel facet hypertrophy, most prominent at L4-L5 and L5-S1. Grade 1 anterolisthesis of L4 on L5. 2. Mild spondylosis with anterior spurring, preservation of disc spaces.  POSTURE: No Significant postural limitations  PALPATION: Some c/o pain over her Gastroc heads.  Mild c/o pain with overpressure at L5-S1.  LOWER E                            Normal left LE motion.  End-range left knee flexion reproduced her left lower leg symptoms LOWER EXTREMITY MMT: Normal LE strength.  LOWER EXTREMITY SPECIAL TESTS:  (-) SLR testing.  Normal left knee stability testing.   TODAY'S TREATMENT                             DATE:11-02-22            EXERCISE LOG  Exercise Repetitions and Resistance Comments  Nustep X 63mns L3   Rocker board    SKTC    DKTC    Bridging    AB brace with SLR    Dying bug  Verbal and tactile  cues for technique  Standing Bird-dog  Arm raise x 3 hold 5 secs, Arm and leg raise x 6 each side hold 5-10 secs    Blank cell = exercise not performed today Manual:   STW to Bil. LB paras x 6 mins  [place] combo 1.5 w/cm2 x 14 mins to LT/RT side LB and SIJ area with Pt RT side lying  Discussed and reviewed HEP.      HEP:  Handout given for Dying bug and bridging.  ASSESSMENT:  CLINICAL IMPRESSION: Pt arrived today doing very well with no Knee pain, but with LBP / tightness. Rx focused on HEP as well as [place] combo and STW to LB. Pt able to meet LTG's except for ADL's due to pain levels greater than 3/10. Dc to HEP   OBJECTIVE IMPAIRMENTS: decreased activity tolerance and pain.   ACTIVITY LIMITATIONS: sitting and bed mobility  PARTICIPATION LIMITATIONS:  Exercise.  REHAB POTENTIAL: Good  CLINICAL DECISION MAKING: Evolving/moderate complexity  EVALUATION COMPLEXITY:  Moderate   GOALS: LONG TERM GOALS: Target date: 11/30/2022   Ind with a HEP. Baseline:  Goal status:  Met  2.  Perform ADL's with pain not > 2-3/10. Baseline:  Goal status: Not MET  pain 3-5/10  3.  Resume exercising. Baseline:  Goal status: Partially met  4.  Sit 30 minutes with pain not > 2/10. Baseline:  Goal status:  Met     PLAN:  PT FREQUENCY: 1-2x/week  PT DURATION: 6 weeks  PLANNED INTERVENTIONS: Therapeutic exercises, Therapeutic activity, Patient/Family education, Self Care, Dry Needling, Electrical stimulation, Cryotherapy, Moist heat, Vasopneumatic device, Ultrasound, and Manual therapy  PLAN FOR NEXT SESSION: DC to HEP   Yazan Gatling,CHRIS, PTA 11/02/2022, 5:08 PM   PHYSICAL THERAPY DISCHARGE SUMMARY  Visits from Start of Care: 6.  Current functional level related to goals / functional outcomes: See above.   Remaining deficits: See goal section.   Education / Equipment:  HEP.  Patient agrees to discharge. Patient goals were partially met. Patient is being discharged due to being pleased with the current functional level.    Mali Applegate MPT

## 2022-11-06 NOTE — Progress Notes (Signed)
Verifying benefits.  Will notify patient once received.

## 2022-11-15 ENCOUNTER — Encounter: Payer: Self-pay | Admitting: Family Medicine

## 2022-11-15 ENCOUNTER — Ambulatory Visit (INDEPENDENT_AMBULATORY_CARE_PROVIDER_SITE_OTHER): Payer: 59 | Admitting: Family Medicine

## 2022-11-15 VITALS — BP 135/62 | HR 89 | Temp 98.7°F | Ht 63.0 in | Wt 150.2 lb

## 2022-11-15 DIAGNOSIS — K5792 Diverticulitis of intestine, part unspecified, without perforation or abscess without bleeding: Secondary | ICD-10-CM | POA: Diagnosis not present

## 2022-11-15 DIAGNOSIS — R1030 Lower abdominal pain, unspecified: Secondary | ICD-10-CM

## 2022-11-15 LAB — URINALYSIS, ROUTINE W REFLEX MICROSCOPIC
Bilirubin, UA: NEGATIVE
Glucose, UA: NEGATIVE
Nitrite, UA: NEGATIVE
Protein,UA: NEGATIVE
Specific Gravity, UA: <1.005 — ABNORMAL LOW (ref 1.005–1.030)
Urobilinogen, Ur: 0.2 mg/dL (ref 0.2–1.0)
pH, UA: 5.5 (ref 5.0–7.5)

## 2022-11-15 LAB — MICROSCOPIC EXAMINATION
Bacteria, UA: NONE SEEN
RBC, Urine: NONE SEEN /hpf (ref 0–2)
Renal Epithel, UA: NONE SEEN /hpf

## 2022-11-15 MED ORDER — CIPROFLOXACIN HCL 500 MG PO TABS: 500.0000 mg | ORAL_TABLET | Freq: Two times a day (BID) | ORAL | 0 refills | Status: AC

## 2022-11-15 MED ORDER — METRONIDAZOLE 500 MG PO TABS: 500.0000 mg | ORAL_TABLET | Freq: Three times a day (TID) | ORAL | 0 refills | Status: AC

## 2022-11-15 NOTE — Patient Instructions (Signed)
Diverticulitis  Diverticulitis is infection or inflammation of small pouches (diverticula) in the colon that form due to a condition called diverticulosis. Diverticula can trap stool (feces) and bacteria, causing infection and inflammation. Diverticulitis may cause severe stomach pain and diarrhea. It may lead to tissue damage in the colon that causes bleeding or blockage. The diverticula may also burst (rupture) and cause infected stool to enter other areas of the abdomen. What are the causes? This condition is caused by stool becoming trapped in the diverticula, which allows bacteria to grow in the diverticula. This leads to inflammation and infection. What increases the risk? You are more likely to develop this condition if you have diverticulosis. The risk increases if you: Are overweight or obese. Do not get enough exercise. Drink alcohol. Use tobacco products. Eat a diet that has a lot of red meat such as beef, pork, or lamb. Eat a diet that does not include enough fiber. High-fiber foods include fruits, vegetables, beans, nuts, and whole grains. Are over 40 years of age. What are the signs or symptoms? Symptoms of this condition may include: Pain and tenderness in the abdomen. The pain is normally located on the left side of the abdomen, but it may occur in other areas. Fever and chills. Nausea. Vomiting. Cramping. Bloating. Changes in bowel routines. Blood in your stool. How is this diagnosed? This condition is diagnosed based on: Your medical history. A physical exam. Tests to make sure there is nothing else causing your condition. These tests may include: Blood tests. Urine tests. CT scan of the abdomen. How is this treated? Most cases of this condition are mild and can be treated at home. Treatment may include: Taking over-the-counter pain medicines. Following a clear liquid diet. Taking antibiotic medicines by mouth. Resting. More severe cases may need to be treated  at a hospital. Treatment may include: Not eating or drinking. Taking prescription pain medicine. Receiving antibiotic medicines through an IV. Receiving fluids and nutrition through an IV. Surgery. When your condition is under control, your health care provider may recommend that you have a colonoscopy. This is an exam to look at the entire large intestine. During the exam, a lubricated, bendable tube is inserted into the anus and then passed into the rectum, colon, and other parts of the large intestine. A colonoscopy can show how severe your diverticula are and whether something else may be causing your symptoms. Follow these instructions at home: Medicines Take over-the-counter and prescription medicines only as told by your health care provider. These include fiber supplements, probiotics, and stool softeners. If you were prescribed an antibiotic medicine, take it as told by your health care provider. Do not stop taking the antibiotic even if you start to feel better. Ask your health care provider if the medicine prescribed to you requires you to avoid driving or using machinery. Eating and drinking  Follow a full liquid diet or another diet as directed by your health care provider. After your symptoms improve, your health care provider may tell you to change your diet. He or she may recommend that you eat a diet that contains at least 25 grams (25 g) of fiber daily. Fiber makes it easier to pass stool. Healthy sources of fiber include: Berries. One cup contains 4-8 grams of fiber. Beans or lentils. One-half cup contains 5-8 grams of fiber. Green vegetables. One cup contains 4 grams of fiber. Avoid eating red meat. General instructions Do not use any products that contain nicotine or tobacco, such as   cigarettes, e-cigarettes, and chewing tobacco. If you need help quitting, ask your health care provider. Exercise for at least 30 minutes, 3 times each week. You should exercise hard enough to  raise your heart rate and break a sweat. Keep all follow-up visits as told by your health care provider. This is important. You may need to have a colonoscopy. Contact a health care provider if: Your pain does not improve. Your bowel movements do not return to normal. Get help right away if: Your pain gets worse. Your symptoms do not get better with treatment. Your symptoms suddenly get worse. You have a fever. You vomit more than one time. You have stools that are bloody, black, or tarry. Summary Diverticulitis is infection or inflammation of small pouches (diverticula) in the colon that form due to a condition called diverticulosis. Diverticula can trap stool (feces) and bacteria, causing infection and inflammation. You are at higher risk for this condition if you have diverticulosis and you eat a diet that does not include enough fiber. Most cases of this condition are mild and can be treated at home. More severe cases may need to be treated at a hospital. When your condition is under control, your health care provider may recommend that you have an exam called a colonoscopy. This exam can show how severe your diverticula are and whether something else may be causing your symptoms. Keep all follow-up visits as told by your health care provider. This is important. This information is not intended to replace advice given to you by your health care provider. Make sure you discuss any questions you have with your health care provider. Document Revised: 09/15/2019 Document Reviewed: 09/15/2019 Elsevier Patient Education  2023 Elsevier Inc.  

## 2022-11-15 NOTE — Progress Notes (Signed)
Acute Office Visit  Subjective:     Patient ID: Carla Moreno, female    DOB: 04/16/1961, 61 y.o.   MRN: [id number]  Chief Complaint  Patient presents with   Abdominal Pain    Abdominal Pain This is a new problem. The onset quality is sudden. The problem occurs constantly. The problem has been gradually worsening. The pain is located in the suprapubic region. The quality of the pain is sharp, cramping and aching. The abdominal pain does not radiate. Associated symptoms include anorexia, myalgias and nausea. Pertinent negatives include no arthralgias, belching, constipation, diarrhea, dysuria, fever, flatus, frequency, headaches, hematochezia, hematuria, melena or vomiting. The pain is aggravated by bowel movement and palpation (sitting). The pain is relieved by Nothing. Treatments tried: tried 2 doses of expired antbitics that she had on hand. The treatment provided no relief. There is no history of abdominal surgery, colon cancer, Crohn's disease, GERD, irritable bowel syndrome, pancreatitis or PUD. diverticulitis  Reports that this is similar to previous diverticulitis flares although the location is not quite the same. Denies urinary symptoms.   Review of Systems  Constitutional:  Negative for fever.  Gastrointestinal:  Positive for abdominal pain, anorexia and nausea. Negative for constipation, diarrhea, flatus, hematochezia, melena and vomiting.  Genitourinary:  Negative for dysuria, frequency and hematuria.  Musculoskeletal:  Positive for myalgias. Negative for arthralgias.  Neurological:  Negative for headaches.        Objective:    BP 135/62   Pulse 89   Temp 98.7 F (37.1 C) (Temporal)   Ht '5\' 3"'$  (1.6 m)   Wt 150 lb 4 oz (68.2 kg)   SpO2 99%   BMI 26.62 kg/m    Physical Exam Vitals and nursing note reviewed.  Constitutional:      General: She is not in acute distress.    Appearance: She is not ill-appearing, toxic-appearing or diaphoretic.  HENT:      Head: Normocephalic and atraumatic.  Cardiovascular:     Rate and Rhythm: Normal rate and regular rhythm.     Heart sounds: Normal heart sounds. No murmur heard. Pulmonary:     Effort: Pulmonary effort is normal. No respiratory distress.     Breath sounds: Normal breath sounds.  Abdominal:     General: Bowel sounds are normal. There is no distension.     Palpations: Abdomen is soft.     Tenderness: There is abdominal tenderness in the right lower quadrant, suprapubic area and left lower quadrant. There is no right CVA tenderness, left CVA tenderness, guarding or rebound. Negative signs include Murphy's sign and McBurney's sign.  Skin:    General: Skin is warm and dry.  Neurological:     General: No focal deficit present.     Mental Status: She is alert and oriented to person, place, and time.  Psychiatric:        Mood and Affect: Mood normal.        Behavior: Behavior normal.     Results for orders placed or performed in visit on 11/15/22  Microscopic Examination   Urine  Result Value Ref Range   WBC, UA 0-5 0 - 5 /hpf   RBC, Urine None seen 0 - 2 /hpf   Epithelial Cells (non renal) 0-10 0 - 10 /hpf   Renal Epithel, UA None seen None seen /hpf   Bacteria, UA None seen None seen/Few  Urinalysis, Routine w reflex microscopic  Result Value Ref Range   Specific Gravity, UA <1.005 (L)  1.005 - 1.030   pH, UA 5.5 5.0 - 7.5   Color, UA Yellow Yellow   Appearance Ur Clear Clear   Leukocytes,UA Trace (A) Negative   Protein,UA Negative Negative/Trace   Glucose, UA Negative Negative   Ketones, UA 1+ (A) Negative   RBC, UA Trace (A) Negative   Bilirubin, UA Negative Negative   Urobilinogen, Ur 0.2 0.2 - 1.0 mg/dL   Nitrite, UA Negative Negative   Microscopic Examination See below:         Assessment & Plan:   Carla Moreno was seen today for abdominal pain.  Diagnoses and all orders for this visit:  Lower abdominal pain Negative for UTI. Culture pending.  -     Urinalysis,  Routine w reflex microscopic -     Urine Culture -     Microscopic Examination  Diverticulitis Will treat for for diverticulitis given history. Discussed diet, hydration. Disussed abdominal CT if symptoms worsen or do not improve.  -     metroNIDAZOLE (FLAGYL) 500 MG tablet; Take 1 tablet (500 mg total) by mouth 3 (three) times daily for 7 days. -     ciprofloxacin (CIPRO) 500 MG tablet; Take 1 tablet (500 mg total) by mouth 2 (two) times daily for 7 days.  Return if symptoms worsen or fail to improve.  The patient indicates understanding of these issues and agrees with the plan.  Gwenlyn Perking, FNP

## 2022-11-17 LAB — URINE CULTURE: Organism ID, Bacteria: NO GROWTH

## 2022-11-28 ENCOUNTER — Encounter: Payer: Self-pay | Admitting: Family Medicine

## 2023-01-03 ENCOUNTER — Telehealth: Payer: Self-pay | Admitting: Family Medicine

## 2023-01-03 MED ORDER — DENOSUMAB 60 MG/ML ~~LOC~~ SOSY: 60.0000 mg | PREFILLED_SYRINGE | SUBCUTANEOUS | 0 refills | Status: DC

## 2023-01-03 NOTE — Telephone Encounter (Signed)
Notification or Prior Authorization is not required for the requested services  Decision ID #:[id number]  The number above acknowledges your inquiry and our response. Please write this number down and refer to it for future inquiries. Coverage and payment for an item or service is governed by the member's benefit plan document, and, if applicable, the provider's participation agreement with the Health Plan.  Patient aware to go online to [email or web address] to sign up for a prolia discount card. RX sent to [facility]. Patient will call and check with them on price and get back with [place].

## 2023-01-04 MED ORDER — DENOSUMAB 60 MG/ML ~~LOC~~ SOSY: 60.0000 mg | PREFILLED_SYRINGE | SUBCUTANEOUS | 0 refills | Status: DC

## 2023-01-04 NOTE — Telephone Encounter (Signed)
sent 

## 2023-01-04 NOTE — Telephone Encounter (Signed)
Pt asking for prolia rx to be sent [facility].

## 2023-01-05 ENCOUNTER — Other Ambulatory Visit (HOSPITAL_COMMUNITY): Payer: Self-pay

## 2023-01-08 MED ORDER — DENOSUMAB 60 MG/ML ~~LOC~~ SOSY: 60.0000 mg | PREFILLED_SYRINGE | SUBCUTANEOUS | 0 refills | Status: DC

## 2023-01-08 NOTE — Telephone Encounter (Signed)
Rx for Prolia sent to [place]

## 2023-01-08 NOTE — Telephone Encounter (Signed)
Let pt a detailed message , she will have to call her pharmacy to get

## 2023-01-08 NOTE — Telephone Encounter (Signed)
Pt has to register for a copay card. Pt says that she needs to provide a rx ID number. Please call back

## 2023-01-08 NOTE — Addendum Note (Signed)
Addended by: Thana Ates on: 01/08/2023 04:19 PM   Modules accepted: Orders

## 2023-01-08 NOTE — Telephone Encounter (Signed)
Patient returning call. Wants to speak to nurse. Please call back

## 2023-01-15 MED ORDER — DENOSUMAB 60 MG/ML ~~LOC~~ SOSY: 60.0000 mg | PREFILLED_SYRINGE | SUBCUTANEOUS | 0 refills | Status: DC

## 2023-01-15 NOTE — Addendum Note (Signed)
Addended by: Thana Ates on: 01/15/2023 02:44 PM   Modules accepted: Orders

## 2023-01-15 NOTE — Telephone Encounter (Signed)
[facility] Rx needs to be sent to [place] [facility] [phone]

## 2023-01-18 ENCOUNTER — Other Ambulatory Visit (HOSPITAL_COMMUNITY): Payer: Self-pay

## 2023-01-24 ENCOUNTER — Telehealth: Payer: Self-pay | Admitting: Family Medicine

## 2023-01-24 NOTE — Telephone Encounter (Signed)
Patient calling to get prolia shot scheduled. Please call back.

## 2023-01-26 NOTE — Telephone Encounter (Signed)
Patient calling to check on the prolia shot, said that she was notified that the shot was delivered to our office on 2/8 - please call back

## 2023-01-26 NOTE — Telephone Encounter (Signed)
Appointment scheduled for Prolia shot on 02/09

## 2023-01-29 ENCOUNTER — Ambulatory Visit (INDEPENDENT_AMBULATORY_CARE_PROVIDER_SITE_OTHER): Payer: 59 | Admitting: *Deleted

## 2023-01-29 DIAGNOSIS — M810 Age-related osteoporosis without current pathological fracture: Secondary | ICD-10-CM | POA: Diagnosis not present

## 2023-01-29 MED ORDER — DENOSUMAB 60 MG/ML ~~LOC~~ SOSY
60.0000 mg | PREFILLED_SYRINGE | Freq: Once | SUBCUTANEOUS | Status: AC
  Administered 2023-01-29: 60 mg via SUBCUTANEOUS

## 2023-01-29 NOTE — Progress Notes (Signed)
Prolia 60 mg given SQ in left upper arm. Patient tolerated well.

## 2023-01-31 ENCOUNTER — Other Ambulatory Visit (HOSPITAL_COMMUNITY): Payer: Self-pay

## 2023-02-02 ENCOUNTER — Other Ambulatory Visit: Payer: Self-pay

## 2023-02-02 ENCOUNTER — Other Ambulatory Visit: Payer: 59

## 2023-02-02 DIAGNOSIS — E7800 Pure hypercholesterolemia, unspecified: Secondary | ICD-10-CM

## 2023-02-02 LAB — CMP14+EGFR
ALT: 21 IU/L (ref 0–32)
AST: 23 IU/L (ref 0–40)
Albumin/Globulin Ratio: 1.9 (ref 1.2–2.2)
Albumin: 4.7 g/dL (ref 3.9–4.9)
Alkaline Phosphatase: 66 IU/L (ref 44–121)
BUN/Creatinine Ratio: 22 (ref 12–28)
BUN: 13 mg/dL (ref 8–27)
Bilirubin Total: 0.8 mg/dL (ref 0.0–1.2)
CO2: 21 mmol/L (ref 20–29)
Calcium: 9.2 mg/dL (ref 8.7–10.3)
Chloride: 102 mmol/L (ref 96–106)
Creatinine, Ser: 0.58 mg/dL (ref 0.57–1.00)
Globulin, Total: 2.5 g/dL (ref 1.5–4.5)
Glucose: 87 mg/dL (ref 70–99)
Potassium: 3.8 mmol/L (ref 3.5–5.2)
Sodium: 139 mmol/L (ref 134–144)
Total Protein: 7.2 g/dL (ref 6.0–8.5)
eGFR: 102 mL/min/{1.73_m2} (ref >59)

## 2023-02-02 LAB — CBC WITH DIFFERENTIAL/PLATELET
Basophils Absolute: 0.1 10*3/uL (ref 0.0–0.2)
Basos: 1 %
EOS (ABSOLUTE): 0.3 10*3/uL (ref 0.0–0.4)
Eos: 5 %
Hematocrit: 38.6 % (ref 34.0–46.6)
Hemoglobin: 13.3 g/dL (ref 11.1–15.9)
Immature Grans (Abs): 0 10*3/uL (ref 0.0–0.1)
Immature Granulocytes: 0 %
Lymphocytes Absolute: 1.9 10*3/uL (ref 0.7–3.1)
Lymphs: 34 %
MCH: 32.1 pg (ref 26.6–33.0)
MCHC: 34.5 g/dL (ref 31.5–35.7)
MCV: 93 fL (ref 79–97)
Monocytes Absolute: 0.4 10*3/uL (ref 0.1–0.9)
Monocytes: 7 %
Neutrophils Absolute: 2.9 10*3/uL (ref 1.4–7.0)
Neutrophils: 53 %
Platelets: 257 10*3/uL (ref 150–450)
RBC: 4.14 x10E6/uL (ref 3.77–5.28)
RDW: 13.1 % (ref 11.7–15.4)
WBC: 5.5 10*3/uL (ref 3.4–10.8)

## 2023-02-02 LAB — LIPID PANEL
Chol/HDL Ratio: 3.7 ratio (ref 0.0–4.4)
Cholesterol, Total: 202 mg/dL — ABNORMAL HIGH (ref 100–199)
HDL: 54 mg/dL (ref >39)
LDL Chol Calc (NIH): 129 mg/dL — ABNORMAL HIGH (ref 0–99)
Triglycerides: 106 mg/dL (ref 0–149)
VLDL Cholesterol Cal: 19 mg/dL (ref 5–40)

## 2023-02-02 NOTE — Telephone Encounter (Signed)
Patient received

## 2023-02-05 ENCOUNTER — Other Ambulatory Visit: Payer: Self-pay

## 2023-02-05 DIAGNOSIS — E7800 Pure hypercholesterolemia, unspecified: Secondary | ICD-10-CM

## 2023-02-05 MED ORDER — EZETIMIBE 10 MG PO TABS: 10.0000 mg | ORAL_TABLET | Freq: Every day | ORAL | 1 refills | Status: DC

## 2023-04-03 ENCOUNTER — Encounter: Payer: Self-pay | Admitting: Family Medicine

## 2023-04-27 ENCOUNTER — Telehealth: Payer: Self-pay | Admitting: Family Medicine

## 2023-04-27 NOTE — Telephone Encounter (Signed)
Can we please check benefits for Prolia.  Injection Due Date:  06/16/2023 

## 2023-04-30 ENCOUNTER — Telehealth: Payer: Self-pay

## 2023-04-30 ENCOUNTER — Other Ambulatory Visit (HOSPITAL_COMMUNITY): Payer: Self-pay

## 2023-04-30 NOTE — Telephone Encounter (Signed)
Prolia VOB initiated via [email or web address] 

## 2023-04-30 NOTE — Telephone Encounter (Signed)
Created new encounter for Prolia BIV. Will route encounter back once benefit verification is complete. 

## 2023-05-04 ENCOUNTER — Other Ambulatory Visit (HOSPITAL_COMMUNITY): Payer: Self-pay

## 2023-05-04 NOTE — Telephone Encounter (Signed)
Last injection was 01/29/23. Next injection should be due in August. Submitted benefit verification with [facility] (please see telephone encounter on 04/30/23). When trying to do a test claim, discovered patient has [employer]. Cannot refill prescription for Prolia until 06/14/23. Can you have patient upload insurance cards to resubmit benefit verification with all insurance info.

## 2023-05-07 ENCOUNTER — Other Ambulatory Visit (HOSPITAL_COMMUNITY): Payer: Self-pay

## 2023-05-07 NOTE — Telephone Encounter (Signed)
Authorization Number: [id number] 05/07/2023-05/06/2024

## 2023-05-29 ENCOUNTER — Other Ambulatory Visit (HOSPITAL_COMMUNITY): Payer: Self-pay

## 2023-05-29 NOTE — Telephone Encounter (Signed)
Pt ready for scheduling for PROLIA on or after : 07/30/23  Out-of-pocket cost due at time of visit: $327 + DEDUCTIBLE  Primary: [employer] Prolia co-insurance: 20% Admin fee co-insurance: 20%  Secondary: --- Prolia co-insurance:  Admin fee co-insurance:   Medical Benefit Details: Date Benefits were checked: 05/02/23 Deductible: $294.06 met of $3200 required/ Coinsurance: 20%/ Admin Fee: 20%  Prior Auth: approved PA# [id number] Expiration Date: 05/06/24   Pharmacy benefit: Copay $--- (REFILL TOO SOON - 06/14/23) If patient wants fill through the pharmacy benefit please send prescription to: [facility], and include estimated need by date in rx notes. Pharmacy will ship medication directly to the office.  Patient IS eligible for Prolia Copay Card. Copay Card can make patient's cost as little as $25. Link to apply: [email or web address]  ** This summary of benefits is an estimation of the patient's out-of-pocket cost. Exact cost may very based on individual plan coverage.

## 2023-07-06 ENCOUNTER — Other Ambulatory Visit: Payer: Self-pay | Admitting: Family Medicine

## 2023-08-02 NOTE — Telephone Encounter (Signed)
Prolia VOB initiated via [email or web address] 

## 2023-08-03 ENCOUNTER — Other Ambulatory Visit (HOSPITAL_COMMUNITY): Payer: Self-pay

## 2023-08-03 NOTE — Telephone Encounter (Signed)
Pt ready for scheduling for PROLIA on or after : 07/30/23   Out-of-pocket cost due at time of visit: $327 + DEDUCTIBLE   Primary: [employer] Prolia co-insurance: 20% Admin fee co-insurance: 20%   Secondary: --- Prolia co-insurance:  Admin fee co-insurance:    Medical Benefit Details: Date Benefits were checked: 08/02/23 Deductible: $294.06 met of $3200 required/ Coinsurance: 20%/ Admin Fee: 20%   Prior Auth: approved PA# [id number] Expiration Date: 05/06/24   Pharmacy benefit: Copay $--- (Must fill at specialty pharmacy) If patient wants fill through the pharmacy benefit please send prescription to: [facility], and include estimated need by date in rx notes. Pharmacy will ship medication directly to the office.   Patient IS eligible for Prolia Copay Card. Copay Card can make patient's cost as little as $25. Link to apply: [email or web address]   ** This summary of benefits is an estimation of the patient's out-of-pocket cost. Exact cost may very based on individual plan coverage.

## 2023-08-21 NOTE — Telephone Encounter (Signed)
Patient is waiting on delivery from mail order.

## 2023-08-24 ENCOUNTER — Encounter (HOSPITAL_COMMUNITY): Payer: Self-pay | Admitting: Emergency Medicine

## 2023-08-24 ENCOUNTER — Other Ambulatory Visit: Payer: Self-pay

## 2023-08-24 DIAGNOSIS — R509 Fever, unspecified: Secondary | ICD-10-CM | POA: Insufficient documentation

## 2023-08-24 DIAGNOSIS — R109 Unspecified abdominal pain: Secondary | ICD-10-CM | POA: Diagnosis present

## 2023-08-24 DIAGNOSIS — R7309 Other abnormal glucose: Secondary | ICD-10-CM | POA: Insufficient documentation

## 2023-08-24 DIAGNOSIS — R110 Nausea: Secondary | ICD-10-CM | POA: Insufficient documentation

## 2023-08-24 LAB — URINALYSIS, ROUTINE W REFLEX MICROSCOPIC
Bacteria, UA: NONE SEEN
Bilirubin Urine: NEGATIVE
Glucose, UA: NEGATIVE mg/dL
Ketones, ur: NEGATIVE mg/dL
Leukocytes,Ua: NEGATIVE
Nitrite: NEGATIVE
Protein, ur: NEGATIVE mg/dL
Specific Gravity, Urine: 1.01 (ref 1.005–1.030)
pH: 7 (ref 5.0–8.0)

## 2023-08-24 NOTE — ED Triage Notes (Signed)
Pt with c/o lower back pain that radiates to her stomach. Denies any associated urinary symptoms but states she has been having chills.

## 2023-08-25 ENCOUNTER — Emergency Department (HOSPITAL_COMMUNITY): Payer: 59

## 2023-08-25 ENCOUNTER — Emergency Department (HOSPITAL_COMMUNITY)
Admission: EM | Admit: 2023-08-25 | Discharge: 2023-08-25 | Disposition: A | Discharge status: Home / Self Care | Payer: 59 | Attending: Emergency Medicine | Admitting: Emergency Medicine

## 2023-08-25 DIAGNOSIS — R509 Fever, unspecified: Secondary | ICD-10-CM

## 2023-08-25 DIAGNOSIS — R7309 Other abnormal glucose: Secondary | ICD-10-CM

## 2023-08-25 DIAGNOSIS — R739 Hyperglycemia, unspecified: Secondary | ICD-10-CM

## 2023-08-25 DIAGNOSIS — R10A1 Flank pain, right side: Secondary | ICD-10-CM

## 2023-08-25 DIAGNOSIS — R109 Unspecified abdominal pain: Secondary | ICD-10-CM

## 2023-08-25 LAB — CBC WITH DIFFERENTIAL/PLATELET
Abs Immature Granulocytes: 0.03 10*3/uL (ref 0.00–0.07)
Basophils Absolute: 0.1 10*3/uL (ref 0.0–0.1)
Basophils Relative: 1 %
Eosinophils Absolute: 0.1 10*3/uL (ref 0.0–0.5)
Eosinophils Relative: 2 %
HCT: 37.9 % (ref 36.0–46.0)
Hemoglobin: 13.1 g/dL (ref 12.0–15.0)
Immature Granulocytes: 0 %
Lymphocytes Relative: 22 %
Lymphs Abs: 1.8 10*3/uL (ref 0.7–4.0)
MCH: 32.3 pg (ref 26.0–34.0)
MCHC: 34.6 g/dL (ref 30.0–36.0)
MCV: 93.6 fL (ref 80.0–100.0)
Monocytes Absolute: 0.6 10*3/uL (ref 0.1–1.0)
Monocytes Relative: 8 %
Neutro Abs: 5.5 10*3/uL (ref 1.7–7.7)
Neutrophils Relative %: 67 %
Platelets: 231 10*3/uL (ref 150–400)
RBC: 4.05 MIL/uL (ref 3.87–5.11)
RDW: 13.1 % (ref 11.5–15.5)
WBC: 8.2 10*3/uL (ref 4.0–10.5)
nRBC: 0 % (ref 0.0–0.2)

## 2023-08-25 LAB — COMPREHENSIVE METABOLIC PANEL
ALT: 28 U/L (ref 0–44)
AST: 29 U/L (ref 15–41)
Albumin: 4.3 g/dL (ref 3.5–5.0)
Alkaline Phosphatase: 52 U/L (ref 38–126)
Anion gap: 10 (ref 5–15)
BUN: 14 mg/dL (ref 8–23)
CO2: 23 mmol/L (ref 22–32)
Calcium: 8.9 mg/dL (ref 8.9–10.3)
Chloride: 105 mmol/L (ref 98–111)
Creatinine, Ser: 0.59 mg/dL (ref 0.44–1.00)
GFR, Estimated: >60 mL/min (ref >60)
Glucose, Bld: 112 mg/dL — ABNORMAL HIGH (ref 70–99)
Potassium: 3.6 mmol/L (ref 3.5–5.1)
Sodium: 138 mmol/L (ref 135–145)
Total Bilirubin: 1.4 mg/dL — ABNORMAL HIGH (ref 0.3–1.2)
Total Protein: 8.2 g/dL — ABNORMAL HIGH (ref 6.5–8.1)

## 2023-08-25 LAB — LIPASE, BLOOD: Lipase: 28 U/L (ref 11–51)

## 2023-08-25 MED ORDER — ONDANSETRON HCL 4 MG/2ML IJ SOLN
4.0000 mg | Freq: Once | INTRAMUSCULAR | Status: AC
  Administered 2023-08-25: 4 mg via INTRAVENOUS
  Filled 2023-08-25: qty 2

## 2023-08-25 MED ORDER — ACETAMINOPHEN 325 MG PO TABS
650.0000 mg | ORAL_TABLET | Freq: Once | ORAL | Status: AC
  Administered 2023-08-25: 650 mg via ORAL
  Filled 2023-08-25: qty 2

## 2023-08-25 MED ORDER — CYCLOBENZAPRINE HCL 10 MG PO TABS
10.0000 mg | ORAL_TABLET | Freq: Once | ORAL | Status: AC
  Administered 2023-08-25: 10 mg via ORAL
  Filled 2023-08-25: qty 1

## 2023-08-25 MED ORDER — CYCLOBENZAPRINE HCL 10 MG PO TABS: 10.0000 mg | ORAL_TABLET | Freq: Three times a day (TID) | ORAL | 0 refills | Status: DC | PRN

## 2023-08-25 MED ORDER — MORPHINE SULFATE (PF) 4 MG/ML IV SOLN
4.0000 mg | Freq: Once | INTRAVENOUS | Status: AC
  Administered 2023-08-25: 4 mg via INTRAVENOUS
  Filled 2023-08-25: qty 1

## 2023-08-25 NOTE — Discharge Instructions (Addendum)
Drink plenty fluids.  Acetaminophen and/or ibuprofen as needed for fever or aching.  Return if you have any new or concerning symptoms.

## 2023-08-25 NOTE — ED Provider Notes (Signed)
[place] [facility] DE[facility]MENT AT [facility] Provider Note   CSN: [id number] Arrival date & time: 08/24/23  2154     History  Chief Complaint  Patient presents with   Back Pain    Carla Moreno is a 62 y.o. female.  The history is provided by the patient.  Back Pain She has history of hyperlipidemia and comes in complaining of right flank pain which started about 6:30 PM.  It radiates to the right lower abdomen.  Her right flank had been sore for about the last 5 days.  She did have subjective fever with some chills but no sweats.  She denies urinary urgency, frequency, tenesmus, dysuria.  She has had some nausea but no vomiting.  She has history of diverticulitis, but that was usually on her left side.   Home Medications Prior to Admission medications   Medication Sig Start Date End Date Taking? Authorizing Provider  CALCIUM PO Take by mouth.    [provider]  cholecalciferol (VITAMIN D3) 25 MCG (1000 UNIT) tablet Take 1,000 Units by mouth daily.    [provider]  Cyanocobalamin (VITAMIN B-12 PO) Take by mouth.    [provider]  ezetimibe (ZETIA) 10 MG tablet Take 1 tablet (10 mg total) by mouth daily. 02/05/23   Raliegh Ip, DO  fexofenadine-pseudoephedrine (ALLEGRA-D 24) 180-240 MG 24 hr tablet Take 1 tablet by mouth every evening. For allergy and congestion 06/14/20   Mechele Claude, MD  polyethylene glycol (MIRALAX / GLYCOLAX) 17 g packet Take 17 g by mouth daily. Uses at least 4 x a week    [provider]  PROLIA 60 MG/ML SOSY injection INJECT 1 ML (60 MG) UNDER THE SKIN EVERY 6 MONTHS 07/06/23   Delynn Flavin M, DO  SUMAtriptan (IMITREX) 100 MG tablet TAKE 0.5-1 TABLETS EVERY 2 (TWO) HOURS AS NEEDED FOR MIGRAINE. DO NOT EXCEED 2 TABS PER 24 HOURS 10/31/22   Delynn Flavin M, DO      Allergies    Patient has no known allergies.    Review of Systems   Review of Systems  Musculoskeletal:  Positive for  back pain.  All other systems reviewed and are negative.   Physical Exam Updated Vital Signs BP (!) 122/56   Pulse 84   Temp (!) 100.4 F (38 C) (Oral)   Resp 18   Ht 5\' 3"  (1.6 m)   Wt 66.2 kg   SpO2 98%   BMI 25.86 kg/m  Physical Exam Vitals and nursing note reviewed.   62 year old female, resting comfortably and in no acute distress. Vital signs are significant for low-grade fever. Oxygen saturation is 98%, which is normal. Head is normocephalic and atraumatic. PERRLA, EOMI. Oropharynx is clear. Neck is nontender and supple without adenopathy or JVD. Back is nontender in the midline.  There is mild right CVA tenderness. Lungs are clear without rales, wheezes, or rhonchi. Chest is nontender. Heart has regular rate and rhythm without murmur. Abdomen is soft, flat, with mild right lower quadrant tenderness.  There is no rebound or guarding. Extremities have no cyanosis or edema, full range of motion is present. Skin is warm and dry without rash. Neurologic: Mental status is normal, cranial nerves are intact, moves all extremities equally.  ED Results / Procedures / Treatments   Labs (all labs ordered are listed, but only abnormal results are displayed) Labs Reviewed  URINALYSIS, ROUTINE W REFLEX MICROSCOPIC - Abnormal; Notable for the following components:  Result Value   Color, Urine STRAW (*)    Hgb urine dipstick SMALL (*)    All other components within normal limits  COMPREHENSIVE METABOLIC PANEL - Abnormal; Notable for the following components:   Glucose, Bld 112 (*)    Total Protein 8.2 (*)    Total Bilirubin 1.4 (*)    All other components within normal limits  LIPASE, BLOOD  CBC WITH DIFFERENTIAL/PLATELET   Radiology CT Renal Stone Study  Result Date: 08/25/2023 CLINICAL DATA:  Low back pain and chills.  Stone suspected. EXAM: CT ABDOMEN AND PELVIS WITHOUT CONTRAST TECHNIQUE: Multidetector CT imaging of the abdomen and pelvis was performed following the  standard protocol without IV contrast. RADIATION DOSE REDUCTION: This exam was performed according to the departmental dose-optimization program which includes automated exposure control, adjustment of the mA and/or kV according to patient size and/or use of iterative reconstruction technique. COMPARISON:  None Available. FINDINGS: Lower chest: There are mild posterior atelectatic changes. Lung bases are otherwise clear. The cardiac size is normal. Hepatobiliary: The liver is unremarkable without contrast. There is homogeneous noncontrast CT attenuation. Gallbladder and bile ducts are unremarkable. Pancreas: Unremarkable without contrast. Spleen: Unremarkable without contrast.  No splenomegaly. Adrenals/Urinary Tract: There is no adrenal mass. There is a 1.9 cm Bosniak 1 cyst in the inferior pole of the left kidney, Hounsfield density is 8. No follow-up imaging is recommended. No other contour deforming abnormality of either kidney is seen. There are a few tiny punctate caliceal stones in the superior pole of the left kidney. No nephrolithiasis is seen on the right. There is no ureteral stone or hydronephrosis and no bladder thickening. Stomach/Bowel: The stomach is contracted. Small bowel is normal caliber without inflammatory changes. The appendix is normal. There is moderate retained stool in the ascending and transverse colon. Descending and sigmoid segments with numerous diverticula without evidence of acute diverticulitis. Vascular/Lymphatic: Aortic atherosclerosis. No enlarged abdominal or pelvic lymph nodes. Reproductive: Status post hysterectomy. No adnexal masses. Other: No abdominal wall hernia or abnormality. No abdominopelvic ascites. Musculoskeletal: Sacral Tarlov cysts are present expanding and remodeling the spinal canal and the S1, 2 and 3 left-sided foramina. There is advanced L4-5 facet hypertrophy with grade 1 L4-5 spondylolisthesis. Mild thoracic and lumbar spondylosis. Electronically Signed    By: Almira Bar M.D.   On: 08/25/2023 06:57    Procedures Procedures    Medications Ordered in ED Medications  acetaminophen (TYLENOL) tablet 650 mg (has no administration in time range)  cyclobenzaprine (FLEXERIL) tablet 10 mg (has no administration in time range)  ondansetron (ZOFRAN) injection 4 mg (4 mg Intravenous Given 08/25/23 0503)  morphine (PF) 4 MG/ML injection 4 mg (4 mg Intravenous Given 08/25/23 0507)    ED Course/ Medical Decision Making/ A&P                                 Medical Decision Making Amount and/or Complexity of Data Reviewed Labs: ordered. Radiology: ordered.  Risk OTC drugs. Prescription drug management.   Right flank pain concerning for renal colic.  Differential diagnosis includes, but is not limited to, pyelonephritis, pancreatitis, cholecystitis, diverticulitis.  These are conditions with significant risk for morbidity and complications.  I have reviewed her urinalysis and my interpretation is normal UA-no evidence of infection or significant hematuria.  I have ordered laboratory workup of CBC, comprehensive metabolic panel, lipase and I have ordered renal stone protocol CT scan.  I have  ordered morphine for pain and ondansetron for nausea.  CT scan shows no acute process.  No evidence of urolithiasis, appendicitis, diverticulitis, pancreatitis.  I have independently viewed the images, and agree with radiologist interpretation.  I have reviewed her laboratory tests, and my interpretation is mildly elevated random glucose and borderline elevated total bilirubin and otherwise normal comprehensive metabolic panel, normal lipase, normal CBC.  At this point, I suspect that her pain is part of a viral illness, consider possibility of passed ureteral calculus.  I am discharging her with instructions to use over-the-counter acetaminophen and NSAIDs as needed for fever or aching, return for new or concerning symptoms.  She is requesting a muscle relaxer, I have  ordered a dose of cyclobenzaprine and I am ordering a prescription for cyclobenzaprine.  Final Clinical Impression(s) / ED Diagnoses Final diagnoses:  Acute right flank pain  Fever in adult  Elevated random blood glucose level    Rx / DC Orders ED Discharge Orders          Ordered    cyclobenzaprine (FLEXERIL) 10 MG tablet  3 times daily PRN        08/25/23 0729              Dione Booze, MD 08/25/23 [id number]

## 2023-08-27 NOTE — Telephone Encounter (Signed)
Received Prolia today, spoke with patient and scheduled appointment for injection on 08/29/23.

## 2023-08-29 ENCOUNTER — Ambulatory Visit: Payer: 59

## 2023-09-18 ENCOUNTER — Ambulatory Visit: Payer: 59

## 2023-09-18 ENCOUNTER — Telehealth (INDEPENDENT_AMBULATORY_CARE_PROVIDER_SITE_OTHER): Payer: 59 | Admitting: Family Medicine

## 2023-09-18 DIAGNOSIS — J329 Chronic sinusitis, unspecified: Secondary | ICD-10-CM | POA: Diagnosis not present

## 2023-09-18 DIAGNOSIS — J40 Bronchitis, not specified as acute or chronic: Secondary | ICD-10-CM | POA: Diagnosis not present

## 2023-09-18 MED ORDER — AMOXICILLIN-POT CLAVULANATE 875-125 MG PO TABS: 1.0000 | ORAL_TABLET | Freq: Two times a day (BID) | ORAL | 0 refills | Status: DC

## 2023-09-18 MED ORDER — GUAIFENESIN-CODEINE 100-10 MG/5ML PO SYRP: 5.0000 mL | ORAL_SOLUTION | Freq: Three times a day (TID) | ORAL | 0 refills | Status: DC | PRN

## 2023-09-18 NOTE — Progress Notes (Signed)
Subjective:    Patient ID: Carla Moreno, female    DOB: October 05, 1961, 62 y.o.   MRN: [id number]   HPI: Carla Moreno is a 62 y.o. female presenting for continuous cough, fever to 99.8, and congestion. Decreased appetite, but can taste. Denies dyspnea.       11/15/2022   10:47 AM 10/17/2022    2:05 PM 09/16/2021   11:10 AM 05/10/2021    4:06 PM 02/18/2021    8:53 AM  Depression screen PHQ 2/9  Decreased Interest 0 0 0 0 0  Down, Depressed, Hopeless 0 0 0 0 0  PHQ - 2 Score 0 0 0 0 0  Altered sleeping 0  0  0  Tired, decreased energy 0  0  0  Change in appetite 0  0  0  Feeling bad or failure about yourself  0  0  0  Trouble concentrating 0  0  0  Moving slowly or fidgety/restless 0  0  0  Suicidal thoughts 0  0  0  PHQ-9 Score 0  0  0  Difficult doing work/chores Not difficult at all  Not difficult at all       Relevant past medical, surgical, family and social history reviewed and updated as indicated.  Interim medical history since our last visit reviewed. Allergies and medications reviewed and updated.  ROS:  Review of Systems  Constitutional:  Negative for activity change, appetite change, chills and fever.  HENT:  Positive for congestion, postnasal drip, rhinorrhea and sinus pressure. Negative for ear discharge, ear pain, hearing loss, nosebleeds, sneezing and trouble swallowing.   Respiratory:  Negative for chest tightness and shortness of breath.   Cardiovascular:  Negative for chest pain and palpitations.  Skin:  Negative for rash.     Social History   Tobacco Use  Smoking Status Former   Current packs/day: 0.00   Average packs/day: 0.5 packs/day for 10.0 years (5.0 ttl pk-yrs)   Types: Cigarettes   Start date: 12/18/1976   Quit date: 12/18/1986   Years since quitting: 36.7  Smokeless Tobacco Never       Objective:     Wt Readings from Last 3 Encounters:  08/24/23 146 lb (66.2 kg)  11/15/22 150 lb 4 oz (68.2 kg)  10/17/22 148 lb (67.1 kg)      Exam deferred. Video visit performed.   Assessment & Plan:   1. Sinobronchitis     Meds ordered this encounter  Medications   amoxicillin-clavulanate (AUGMENTIN) 875-125 MG tablet    Sig: Take 1 tablet by mouth 2 (two) times daily. Take all of this medication    Dispense:  20 tablet    Refill:  0   guaiFENesin-codeine (ROBITUSSIN AC) 100-10 MG/5ML syrup    Sig: Take 5 mLs by mouth 3 (three) times daily as needed for cough.    Dispense:  120 mL    Refill:  0    No orders of the defined types were placed in this encounter.     Diagnoses and all orders for this visit:  Sinobronchitis  Other orders -     amoxicillin-clavulanate (AUGMENTIN) 875-125 MG tablet; Take 1 tablet by mouth 2 (two) times daily. Take all of this medication -     guaiFENesin-codeine (ROBITUSSIN AC) 100-10 MG/5ML syrup; Take 5 mLs by mouth 3 (three) times daily as needed for cough.    Virtual Visit  Note  I discussed the limitations, risks, security and privacy concerns of  performing an evaluation and management service by video and the availability of in person appointments. The patient was identified with two identifiers. Pt.expressed understanding and agreed to proceed. Pt. Is at home. Dr. Darlyn Read is in his office.  Follow Up Instructions:   I discussed the assessment and treatment plan with the patient. The patient was provided an opportunity to ask questions and all were answered. The patient agreed with the plan and demonstrated an understanding of the instructions.   The patient was advised to call back or seek an in-person evaluation if the symptoms worsen or if the condition fails to improve as anticipated.   Total minutes contact time: 16   Follow up plan: Return if symptoms worsen or fail to improve.  Mechele Claude, MD [facility]

## 2023-10-01 ENCOUNTER — Ambulatory Visit (INDEPENDENT_AMBULATORY_CARE_PROVIDER_SITE_OTHER): Payer: 59

## 2023-10-01 DIAGNOSIS — M810 Age-related osteoporosis without current pathological fracture: Secondary | ICD-10-CM

## 2023-10-01 MED ORDER — DENOSUMAB 60 MG/ML ~~LOC~~ SOSY
60.0000 mg | PREFILLED_SYRINGE | Freq: Once | SUBCUTANEOUS | Status: AC
Start: 2023-10-01 — End: 2023-10-01
  Administered 2023-10-01: 60 mg via SUBCUTANEOUS

## 2023-10-01 NOTE — Progress Notes (Signed)
Prolia injection given to right upper arm.  Patient tolerated well. Patient supplied medication, [facility]

## 2023-11-13 ENCOUNTER — Encounter: Payer: Self-pay | Admitting: Family Medicine

## 2023-11-20 ENCOUNTER — Encounter: Payer: Self-pay | Admitting: Family Medicine

## 2023-12-27 ENCOUNTER — Encounter: Payer: Self-pay | Admitting: Family Medicine

## 2023-12-27 ENCOUNTER — Ambulatory Visit (INDEPENDENT_AMBULATORY_CARE_PROVIDER_SITE_OTHER): Payer: 59 | Admitting: Family Medicine

## 2023-12-27 VITALS — BP 123/77 | HR 74 | Temp 98.3°F | Ht 63.0 in | Wt 150.0 lb

## 2023-12-27 DIAGNOSIS — R21 Rash and other nonspecific skin eruption: Secondary | ICD-10-CM | POA: Diagnosis not present

## 2023-12-27 MED ORDER — VALACYCLOVIR HCL 1 G PO TABS
1000.0000 mg | ORAL_TABLET | Freq: Three times a day (TID) | ORAL | 0 refills | Status: AC
Start: 2023-12-27 — End: 2024-01-06

## 2023-12-27 MED ORDER — MUPIROCIN CALCIUM 2 % EX CREA: 1.0000 | TOPICAL_CREAM | Freq: Two times a day (BID) | CUTANEOUS | 0 refills | Status: DC

## 2023-12-27 NOTE — Progress Notes (Signed)
 Subjective:  Patient ID: Carla Moreno, female    DOB: Apr 09, 1961, 63 y.o.   MRN: [id number]  Patient Care Team: Jolinda Norene HERO, DO as PCP - General (Family Medicine) Porter Andrez SAUNDERS, PA-C as Physician Assistant (Dermatology) [facility], [facility]   Chief Complaint:  Rash (Patient concern for shingles/)   HPI: Carla Moreno is a 63 y.o. female presenting on 12/27/2023 for Rash (Patient concern for shingles/)  Rash  States that it started about one week ago. She reports that a few days ago she started putting neosporin on it which helped soothe itching. Reports that it blistered and now is more crusted. She reports a newer lesion on her lower back. Denies pain, tingling. Endorses itching. States that she is not vaccinated against shingles.   Relevant past medical, surgical, family, and social history reviewed and updated as indicated.  Allergies and medications reviewed and updated. Data reviewed: Chart in Epic.   Past Medical History:  Diagnosis Date   Allergy    enviromental   Diverticulitis of large intestine without perforation or abscess without bleeding 05/10/2021   Hx of adenomatous colonic polyps 08/25/2021   2 diminutive   Hyperlipidemia    Migraines    past hx   Osteoporosis     Past Surgical History:  Procedure Laterality Date   ABDOMINAL HYSTERECTOMY  12/19/1999   CESAREAN SECTION     2 occasions   COLONOSCOPY  09/18/2011   TUBAL LIGATION     prior to hysterectomy    Social History   Socioeconomic History   Marital status: Married    Spouse name: Not on file   Number of children: Not on file   Years of education: Not on file   Highest education level: Not on file  Occupational History   Not on file  Tobacco Use   Smoking status: Former    Current packs/day: 0.00    Average packs/day: 0.5 packs/day for 10.0 years (5.0 ttl pk-yrs)    Types: Cigarettes    Start date: 12/18/1976    Quit date: 12/18/1986    Years since  quitting: 37.0   Smokeless tobacco: Never  Vaping Use   Vaping status: Never Used  Substance and Sexual Activity   Alcohol use: Yes    Alcohol/week: 2.0 standard drinks of alcohol    Types: 2 Glasses of wine per week   Drug use: No   Sexual activity: Not on file  Other Topics Concern   Not on file  Social History Narrative   Not on file   Social Drivers of Health   Financial Resource Strain: Not on file  Food Insecurity: Not on file  Transportation Needs: Not on file  Physical Activity: Not on file  Stress: Not on file  Social Connections: Not on file  Intimate Partner Violence: Not on file    Outpatient Encounter Medications as of 12/27/2023  Medication Sig   cholecalciferol (VITAMIN D3) 25 MCG (1000 UNIT) tablet Take 1,000 Units by mouth daily.   ezetimibe  (ZETIA ) 10 MG tablet Take 1 tablet (10 mg total) by mouth daily.   fexofenadine -pseudoephedrine  (ALLEGRA-D 24) 180-240 MG 24 hr tablet Take 1 tablet by mouth every evening. For allergy and congestion   polyethylene glycol (MIRALAX / GLYCOLAX) 17 g packet Take 17 g by mouth daily. Uses at least 4 x a week   PROLIA  60 MG/ML SOSY injection INJECT 1 ML (60 MG) UNDER THE SKIN EVERY 6 MONTHS   SUMAtriptan  (IMITREX )  100 MG tablet TAKE 0.5-1 TABLETS EVERY 2 (TWO) HOURS AS NEEDED FOR MIGRAINE. DO NOT EXCEED 2 TABS PER 24 HOURS   [DISCONTINUED] amoxicillin -clavulanate (AUGMENTIN ) 875-125 MG tablet Take 1 tablet by mouth 2 (two) times daily. Take all of this medication   [DISCONTINUED] CALCIUM  PO Take by mouth.   [DISCONTINUED] Cyanocobalamin (VITAMIN B-12 PO) Take by mouth.   [DISCONTINUED] cyclobenzaprine  (FLEXERIL ) 10 MG tablet Take 1 tablet (10 mg total) by mouth 3 (three) times daily as needed for muscle spasms.   [DISCONTINUED] guaiFENesin -codeine  (ROBITUSSIN AC) 100-10 MG/5ML syrup Take 5 mLs by mouth 3 (three) times daily as needed for cough.   No facility-administered encounter medications on file as of 12/27/2023.    No  Known Allergies  Review of Systems As per HPI  Objective:  BP 123/77   Pulse 74   Temp 98.3 F (36.8 C)   Ht 5' 3 (1.6 m)   Wt 150 lb (68 kg)   SpO2 99%   BMI 26.57 kg/m    Wt Readings from Last 3 Encounters:  12/27/23 150 lb (68 kg)  08/24/23 146 lb (66.2 kg)  11/15/22 150 lb 4 oz (68.2 kg)    Physical Exam Constitutional:      General: She is awake. She is not in acute distress.    Appearance: Normal appearance. She is well-developed and well-groomed. She is not ill-appearing, toxic-appearing or diaphoretic.  Cardiovascular:     Rate and Rhythm: Normal rate and regular rhythm.     Pulses: Normal pulses.          Radial pulses are 2+ on the right side and 2+ on the left side.       Posterior tibial pulses are 2+ on the right side and 2+ on the left side.     Heart sounds: Normal heart sounds. No murmur heard.    No gallop.  Pulmonary:     Effort: Pulmonary effort is normal. No respiratory distress.     Breath sounds: Normal breath sounds. No stridor. No wheezing, rhonchi or rales.  Musculoskeletal:     Cervical back: Full passive range of motion without pain and neck supple.     Right lower leg: No edema.     Left lower leg: No edema.  Skin:    General: Skin is warm.     Capillary Refill: Capillary refill takes less than 2 seconds.     Findings: Lesion and rash present. Rash is vesicular.     Comments: Patch of erythematous lesions in linear pattern along left abdomen with satellite lesion on left lower back   Neurological:     General: No focal deficit present.     Mental Status: She is alert, oriented to person, place, and time and easily aroused. Mental status is at baseline.     GCS: GCS eye subscore is 4. GCS verbal subscore is 5. GCS motor subscore is 6.     Motor: No weakness.  Psychiatric:        Attention and Perception: Attention and perception normal.        Mood and Affect: Mood and affect normal.        Speech: Speech normal.        Behavior:  Behavior normal. Behavior is cooperative.        Thought Content: Thought content normal. Thought content does not include homicidal or suicidal ideation. Thought content does not include homicidal or suicidal plan.        Cognition and  Memory: Cognition and memory normal.        Judgment: Judgment normal.         Results for orders placed or performed during the hospital encounter of 08/25/23  Urinalysis, Routine w reflex microscopic -Urine, Clean Catch   Collection Time: 08/24/23 10:48 PM  Result Value Ref Range   Color, Urine STRAW (A) YELLOW   APPearance CLEAR CLEAR   Specific Gravity, Urine 1.010 1.005 - 1.030   pH 7.0 5.0 - 8.0   Glucose, UA NEGATIVE NEGATIVE mg/dL   Hgb urine dipstick SMALL (A) NEGATIVE   Bilirubin Urine NEGATIVE NEGATIVE   Ketones, ur NEGATIVE NEGATIVE mg/dL   Protein, ur NEGATIVE NEGATIVE mg/dL   Nitrite NEGATIVE NEGATIVE   Leukocytes,Ua NEGATIVE NEGATIVE   RBC / HPF 0-5 0 - 5 RBC/hpf   WBC, UA 0-5 0 - 5 WBC/hpf   Bacteria, UA NONE SEEN NONE SEEN   Squamous Epithelial / HPF 0-5 0 - 5 /HPF  Comprehensive metabolic panel   Collection Time: 08/25/23  5:05 AM  Result Value Ref Range   Sodium 138 135 - 145 mmol/L   Potassium 3.6 3.5 - 5.1 mmol/L   Chloride 105 98 - 111 mmol/L   CO2 23 22 - 32 mmol/L   Glucose, Bld 112 (H) 70 - 99 mg/dL   BUN 14 8 - 23 mg/dL   Creatinine, Ser 9.40 0.44 - 1.00 mg/dL   Calcium  8.9 8.9 - 10.3 mg/dL   Total Protein 8.2 (H) 6.5 - 8.1 g/dL   Albumin 4.3 3.5 - 5.0 g/dL   AST 29 15 - 41 U/L   ALT 28 0 - 44 U/L   Alkaline Phosphatase 52 38 - 126 U/L   Total Bilirubin 1.4 (H) 0.3 - 1.2 mg/dL   GFR, Estimated >39 >39 mL/min   Anion gap 10 5 - 15  Lipase, blood   Collection Time: 08/25/23  5:05 AM  Result Value Ref Range   Lipase 28 11 - 51 U/L  CBC with Differential   Collection Time: 08/25/23  5:05 AM  Result Value Ref Range   WBC 8.2 4.0 - 10.5 K/uL   RBC 4.05 3.87 - 5.11 MIL/uL   Hemoglobin 13.1 12.0 - 15.0 g/dL    HCT 62.0 63.9 - 53.9 %   MCV 93.6 80.0 - 100.0 fL   MCH 32.3 26.0 - 34.0 pg   MCHC 34.6 30.0 - 36.0 g/dL   RDW 86.8 88.4 - 84.4 %   Platelets 231 150 - 400 K/uL   nRBC 0.0 0.0 - 0.2 %   Neutrophils Relative % 67 %   Neutro Abs 5.5 1.7 - 7.7 K/uL   Lymphocytes Relative 22 %   Lymphs Abs 1.8 0.7 - 4.0 K/uL   Monocytes Relative 8 %   Monocytes Absolute 0.6 0.1 - 1.0 K/uL   Eosinophils Relative 2 %   Eosinophils Absolute 0.1 0.0 - 0.5 K/uL   Basophils Relative 1 %   Basophils Absolute 0.1 0.0 - 0.1 K/uL   Immature Granulocytes 0 %   Abs Immature Granulocytes 0.03 0.00 - 0.07 K/uL       11/15/2022   10:47 AM 10/17/2022    2:05 PM 09/16/2021   11:10 AM 05/10/2021    4:06 PM 02/18/2021    8:53 AM  Depression screen PHQ 2/9  Decreased Interest 0 0 0 0 0  Down, Depressed, Hopeless 0 0 0 0 0  PHQ - 2 Score 0 0 0 0 0  Altered sleeping 0  0  0  Tired, decreased energy 0  0  0  Change in appetite 0  0  0  Feeling bad or failure about yourself  0  0  0  Trouble concentrating 0  0  0  Moving slowly or fidgety/restless 0  0  0  Suicidal thoughts 0  0  0  PHQ-9 Score 0  0  0  Difficult doing work/chores Not difficult at all  Not difficult at all         12/27/2023   10:49 AM 11/15/2022   10:47 AM 10/17/2022    2:05 PM 09/16/2021   11:13 AM  GAD 7 : Generalized Anxiety Score  Nervous, Anxious, on Edge 0 0 0 0  Control/stop worrying 0 0 0 0  Worry too much - different things 1 0 0 0  Trouble relaxing 1 0 0 0  Restless 1 0 0 0  Easily annoyed or irritable 0 0 0 0  Afraid - awful might happen 0 0 0 0  Total GAD 7 Score 3 0 0 0  Anxiety Difficulty Not difficult at all Not difficult at all Not difficult at all Not difficult at all   Pertinent labs & imaging results that were available during my care of the patient were reviewed by me and considered in my medical decision making.  Assessment & Plan:  Carla Moreno was seen today for rash.  Diagnoses and all orders for this  visit:  Rash Will treat for valtrex  as below. Will provide mupirocin  to cover for superimposed infection.  -     valACYclovir  (VALTREX ) 1000 MG tablet; Take 1 tablet (1,000 mg total) by mouth 3 (three) times daily for 10 days. -     mupirocin  cream (BACTROBAN ) 2 %; Apply 1 Application topically 2 (two) times daily.  Continue all other maintenance medications.  Follow up plan: Return if symptoms worsen or fail to improve.   Continue healthy lifestyle choices, including diet (rich in fruits, vegetables, and lean proteins, and low in salt and simple carbohydrates) and exercise (at least 30 minutes of moderate physical activity daily).  Written and verbal instructions provided   The above assessment and management plan was discussed with the patient. The patient verbalized understanding of and has agreed to the management plan. Patient is aware to call the clinic if they develop any new symptoms or if symptoms persist or worsen. Patient is aware when to return to the clinic for a follow-up visit. Patient educated on when it is appropriate to go to the emergency department.   Marry Kins, DNP-FNP [facility] [address] [place], [place] [address] [phone]

## 2023-12-28 ENCOUNTER — Encounter: Payer: Self-pay | Admitting: Family Medicine

## 2023-12-28 ENCOUNTER — Other Ambulatory Visit: Payer: Self-pay | Admitting: Family Medicine

## 2023-12-28 MED ORDER — MUPIROCIN 2 % EX OINT: 1.0000 | TOPICAL_OINTMENT | Freq: Two times a day (BID) | CUTANEOUS | 0 refills | Status: DC

## 2024-01-31 ENCOUNTER — Telehealth (INDEPENDENT_AMBULATORY_CARE_PROVIDER_SITE_OTHER): Payer: 59 | Admitting: Family Medicine

## 2024-01-31 ENCOUNTER — Encounter: Payer: Self-pay | Admitting: Family Medicine

## 2024-01-31 DIAGNOSIS — R519 Headache, unspecified: Secondary | ICD-10-CM

## 2024-01-31 DIAGNOSIS — R051 Acute cough: Secondary | ICD-10-CM

## 2024-01-31 DIAGNOSIS — R509 Fever, unspecified: Secondary | ICD-10-CM | POA: Diagnosis not present

## 2024-01-31 LAB — RSV AG, IMMUNOCHR, WAIVED: RSV Ag, Immunochr, Waived: NEGATIVE

## 2024-01-31 LAB — VERITOR FLU A/B WAIVED
Influenza A: NEGATIVE
Influenza B: NEGATIVE

## 2024-01-31 MED ORDER — PROMETHAZINE-DM 6.25-15 MG/5ML PO SYRP
5.0000 mL | ORAL_SOLUTION | Freq: Four times a day (QID) | ORAL | 0 refills | Status: DC | PRN
Start: 2024-01-31 — End: 2024-10-01

## 2024-01-31 MED ORDER — BENZONATATE 100 MG PO CAPS
100.0000 mg | ORAL_CAPSULE | Freq: Three times a day (TID) | ORAL | 0 refills | Status: DC | PRN
Start: 2024-01-31 — End: 2024-10-01

## 2024-01-31 NOTE — Progress Notes (Signed)
Negative for all. Called and discussed with patient

## 2024-01-31 NOTE — Progress Notes (Signed)
Virtual Visit via Video Note  I connected with Carla Moreno on 01/31/24 at  8:50 AM EST by a video enabled telemedicine application and verified that I am speaking with the correct person using two identifiers.  Patient Location: Home Provider Location: Office/Clinic  I discussed the limitations, risks, security, and privacy concerns of performing an evaluation and management service by video and the availability of in person appointments. I also discussed with the patient that there may be a patient responsible charge related to this service. The patient expressed understanding and agreed to proceed.  Subjective: PCP: Raliegh Ip, DO  Chief Complaint  Patient presents with   Cough   Cough   States that husband had RSV last week. She was exposed to flu this week with funeral preparations for her mother in law. Reports that two days ago started with scratchy throat, headache, fever, 101 at home, nausea. Taking mucinex and dramamine, advil and imitrex for her headaches.   ROS: Per HPI  Current Outpatient Medications:    cholecalciferol (VITAMIN D3) 25 MCG (1000 UNIT) tablet, Take 1,000 Units by mouth daily., Disp: , Rfl:    ezetimibe (ZETIA) 10 MG tablet, Take 1 tablet (10 mg total) by mouth daily., Disp: 90 tablet, Rfl: 1   fexofenadine-pseudoephedrine (ALLEGRA-D 24) 180-240 MG 24 hr tablet, Take 1 tablet by mouth every evening. For allergy and congestion, Disp: 30 tablet, Rfl: 11   mupirocin ointment (BACTROBAN) 2 %, Apply 1 Application topically 2 (two) times daily., Disp: 22 g, Rfl: 0   polyethylene glycol (MIRALAX / GLYCOLAX) 17 g packet, Take 17 g by mouth daily. Uses at least 4 x a week, Disp: , Rfl:    PROLIA 60 MG/ML SOSY injection, INJECT 1 ML (60 MG) UNDER THE SKIN EVERY 6 MONTHS, Disp: 1 mL, Rfl: 0   SUMAtriptan (IMITREX) 100 MG tablet, TAKE 0.5-1 TABLETS EVERY 2 (TWO) HOURS AS NEEDED FOR MIGRAINE. DO NOT EXCEED 2 TABS PER 24 HOURS, Disp: 9 tablet, Rfl:  PRN  Observations/Objective: There were no vitals filed for this visit. Physical Exam Constitutional:      General: She is awake. She is not in acute distress.    Appearance: Normal appearance. She is well-developed and well-groomed. She is not ill-appearing, toxic-appearing or diaphoretic.  HENT:     Nose: Congestion and rhinorrhea present. Rhinorrhea is clear.  Cardiovascular:     Rate and Rhythm: Regular rhythm.  Pulmonary:     Effort: Pulmonary effort is normal.  Neurological:     General: No focal deficit present.     Mental Status: She is alert and oriented to person, place, and time.  Psychiatric:        Attention and Perception: Attention and perception normal.        Mood and Affect: Mood and affect normal.        Speech: Speech normal.        Behavior: Behavior normal. Behavior is cooperative.        Thought Content: Thought content normal.        Cognition and Memory: Cognition and memory normal.        Judgment: Judgment normal.    Assessment and Plan: 1. Fever, unspecified fever cause (Primary) Discussed with patient that likely viral in etiology. Discussed symptomatic care and when to follow up. Medications as below to assist with symptomatic care. Negative for flu, RSV on rapid swab. Communicated to patient.  - RSV Ag, Immunochr, Waived - Veritor Flu A/B  Waived  2. Acute nonintractable headache, unspecified headache type As above.  - RSV Ag, Immunochr, Waived - Veritor Flu A/B Waived  3. Acute cough As above.  - RSV Ag, Immunochr, Waived - Veritor Flu A/B Waived - promethazine-dextromethorphan (PROMETHAZINE-DM) 6.25-15 MG/5ML syrup; Take 5 mLs by mouth 4 (four) times daily as needed.  Dispense: 118 mL; Refill: 0 - benzonatate (TESSALON PERLES) 100 MG capsule; Take 1 capsule (100 mg total) by mouth 3 (three) times daily as needed.  Dispense: 30 capsule; Refill: 0   Appt time 01/31/2024 08:50 AM Call duration: 00:04:01  Follow Up Instructions: Return if  symptoms worsen or fail to improve.   I discussed the assessment and treatment plan with the patient. The patient was provided an opportunity to ask questions, and all were answered. The patient agreed with the plan and demonstrated an understanding of the instructions.   The patient was advised to call back or seek an in-person evaluation if the symptoms worsen or if the condition fails to improve as anticipated.  The above assessment and management plan was discussed with the patient. The patient verbalized understanding of and has agreed to the management plan.   Neale Burly, DNP-FNP [facility] [address] [place], [place] [address] [phone]

## 2024-02-04 ENCOUNTER — Other Ambulatory Visit: Payer: Self-pay | Admitting: Family Medicine

## 2024-02-25 ENCOUNTER — Telehealth: Payer: Self-pay

## 2024-02-25 DIAGNOSIS — M810 Age-related osteoporosis without current pathological fracture: Secondary | ICD-10-CM

## 2024-02-25 MED ORDER — DENOSUMAB 60 MG/ML ~~LOC~~ SOSY
60.0000 mg | PREFILLED_SYRINGE | Freq: Once | SUBCUTANEOUS | Status: AC
Start: 2024-04-01 — End: ?

## 2024-02-25 NOTE — Telephone Encounter (Signed)
 Prolia ordered for PA team to review verification benefits

## 2024-03-12 ENCOUNTER — Encounter: Payer: Self-pay | Admitting: Family Medicine

## 2024-03-12 ENCOUNTER — Other Ambulatory Visit: Payer: Self-pay | Admitting: Family Medicine

## 2024-03-12 DIAGNOSIS — E7800 Pure hypercholesterolemia, unspecified: Secondary | ICD-10-CM

## 2024-03-12 NOTE — Telephone Encounter (Signed)
 Spoke with pt she declined to schedule appt at this time will call back once she is ready to schedule appt

## 2024-03-12 NOTE — Telephone Encounter (Signed)
 [place] NTBS Last OV 10/17/22 NO RF sent to pharmacy last OV greater than a year

## 2024-03-12 NOTE — Telephone Encounter (Signed)
 LMTCB. Letter mailed ?

## 2024-03-19 ENCOUNTER — Other Ambulatory Visit (HOSPITAL_COMMUNITY): Payer: Self-pay

## 2024-03-19 ENCOUNTER — Telehealth: Payer: Self-pay

## 2024-03-19 NOTE — Telephone Encounter (Signed)
 Prolia VOB initiated via [email or web address]  Next Prolia inj DUE: 03/31/24

## 2024-03-21 ENCOUNTER — Other Ambulatory Visit (HOSPITAL_COMMUNITY): Payer: Self-pay

## 2024-03-21 NOTE — Telephone Encounter (Signed)
 Pt ready for scheduling for PROLIA on or after : 03/31/24  Option# 1: Buy/Bill (Office supplied medication)  Out-of-pocket cost due at time of clinic visit: $50 + DEDUCTIBLE  Number of injection/visits approved: 2  Primary: [employer] Prolia co-insurance: $25 Admin fee co-insurance: $25  Secondary: --- Prolia co-insurance:  Admin fee co-insurance:   Medical Benefit Details: Date Benefits were checked: 03/20/24 Deductible: $353.27 Met of $3300 Required/ Coinsurance: $25/ Admin Fee: $25  Prior Auth: APPROVED PA# [id number] Expiration Date: 05/07/23-05/06/24  # of doses approved: 2 ----------------------------------------------------------------------- Option# 2- Med Obtained from pharmacy:  Pharmacy benefit: Copay $--- FILL AT SPECIALTY (Paid to pharmacy) Admin Fee: $25 (Pay at clinic)  Prior Auth: --- PA# Expiration Date:   # of doses approved:   If patient wants fill through the pharmacy benefit please send prescription to: [facility], and include estimated need by date in rx notes. Pharmacy will ship medication directly to the office.  Patient IS eligible for Prolia Copay Card. Copay Card can make patient's cost as little as $25. Link to apply: [email or web address]://www.amgensupportplus.com/copay  ** This summary of benefits is an estimation of the patient's out-of-pocket cost. Exact cost may very based on individual plan coverage.

## 2024-03-21 NOTE — Telephone Encounter (Signed)
 Carla Moreno

## 2024-03-28 NOTE — Telephone Encounter (Signed)
 Called patient regarding cost and to schedule appointment - LMTCB.

## 2024-04-21 NOTE — Telephone Encounter (Signed)
 I have called patient on 03/28/24, 04/08/24, and 04/21/24 no answer LMTCB. Will mail letter for patient to contact the office .

## 2024-06-03 ENCOUNTER — Other Ambulatory Visit: Payer: Self-pay

## 2024-06-03 DIAGNOSIS — G43009 Migraine without aura, not intractable, without status migrainosus: Secondary | ICD-10-CM

## 2024-06-03 DIAGNOSIS — E7800 Pure hypercholesterolemia, unspecified: Secondary | ICD-10-CM

## 2024-06-03 MED ORDER — EZETIMIBE 10 MG PO TABS
10.0000 mg | ORAL_TABLET | Freq: Every day | ORAL | 1 refills | Status: DC
Start: 2024-06-03 — End: 2024-10-01

## 2024-06-03 MED ORDER — SUMATRIPTAN SUCCINATE 100 MG PO TABS: 100.0000 mg | ORAL_TABLET | ORAL | 99 refills | Status: AC | PRN

## 2024-06-03 NOTE — Telephone Encounter (Signed)
 Called and spoke with pt , got her a apt made in 3 months - refills sent to pharmacy

## 2024-06-03 NOTE — Telephone Encounter (Signed)
 Called and spoke to the patient - due to cost she would like to discuss alternatives to the Prolia .   Patient is also requesting a refill on Zetia  and Imitrex  Last OV with PCP was 10/17/2022 for CPE. Please advise if okay to fill for 30 days to give time for patient to get in of OV

## 2024-06-03 NOTE — Telephone Encounter (Signed)
 Ok to refill 53m supply of both meds and cosign to me.  She MUST have OV with fasting labs. Please see if [facility] can find her something in the next 3 months.  We can talk about alternatives for the osteoporosis med at that visit as she will need VIt D, CMP before she can start anything else.

## 2024-07-15 ENCOUNTER — Other Ambulatory Visit: Payer: Self-pay

## 2024-10-01 ENCOUNTER — Ambulatory Visit (INDEPENDENT_AMBULATORY_CARE_PROVIDER_SITE_OTHER): Admitting: Family Medicine

## 2024-10-01 VITALS — BP 122/62 | HR 78 | Temp 97.9°F | Ht 63.0 in | Wt 148.2 lb

## 2024-10-01 DIAGNOSIS — E559 Vitamin D deficiency, unspecified: Secondary | ICD-10-CM | POA: Diagnosis not present

## 2024-10-01 DIAGNOSIS — E7800 Pure hypercholesterolemia, unspecified: Secondary | ICD-10-CM

## 2024-10-01 DIAGNOSIS — Z0001 Encounter for general adult medical examination with abnormal findings: Secondary | ICD-10-CM | POA: Diagnosis not present

## 2024-10-01 DIAGNOSIS — M810 Age-related osteoporosis without current pathological fracture: Secondary | ICD-10-CM

## 2024-10-01 DIAGNOSIS — Z0000 Encounter for general adult medical examination without abnormal findings: Secondary | ICD-10-CM

## 2024-10-01 DIAGNOSIS — G43009 Migraine without aura, not intractable, without status migrainosus: Secondary | ICD-10-CM

## 2024-10-01 LAB — LIPID PANEL

## 2024-10-01 MED ORDER — EZETIMIBE 10 MG PO TABS: 10.0000 mg | ORAL_TABLET | Freq: Every day | ORAL | 3 refills | Status: AC

## 2024-10-01 NOTE — Patient Instructions (Addendum)
 Please keep mammogram appointment Pneumonia, shingles and influenza vaccines were offered today. Bone density scan to follow up osteoporosis due 10/18/2024. Please make sure you get this scheduled

## 2024-10-01 NOTE — Progress Notes (Signed)
 Carla Moreno is a 63 y.o. female presents to office today for annual physical exam examination.     Dawn reports that she has been doing really well since her last visit.  She just got back from a trip to [place] with her friend Montie.  She has been trying to do as much traveling as possible.  She has officially retired in life has been good.  She has had more time for meal planning and structured physical exercise.  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  She reports no medical changes to the family history.  Her 2 sons and her 5 grandchildren have remained healthy and her father is still living and doing well.  She has been followed by the retinal center for what her optometrist thought was a possible tear but they note that she in fact does not have a tear.  For her bones, she has been taking Winnie over-the-counter which is a calcium  with magnesium supplement.  She is doing weightbearing exercise and again trying to follow a balanced diet  Having less than 2 migraines per 56-month.  So not utilizing Imitrex  very much at all.  She is compliant with her Zetia  for cholesterol  Occupation: retired/ part time with [employer], Marital status: married, Substance use: wine Health Maintenance Due  Topic Date Due   HIV Screening  Never done   Hepatitis C Screening  Never done   Pneumococcal Vaccine: 50+ Years (1 of 1 - PCV) Never done   Zoster Vaccines- Shingrix (1 of 2) Never done   Influenza Vaccine  07/18/2024   COVID-19 Vaccine (4 - 2025-26 season) 08/18/2024   Mammogram  10/01/2024    Immunization History  Administered Date(s) Administered   Influenza Inj Mdck Quad With Preservative 09/29/2019   Influenza Split 09/22/2013   Influenza,inj,Quad PF,6+ Mos 10/06/2014, 09/27/2015, 09/18/2016, 10/01/2017, 10/01/2018, 09/16/2021, 10/05/2022   Influenza-Unspecified 10/04/2020   Moderna Sars-Covid-2 Vaccination 02/20/2020, 03/24/2020, 12/02/2020    Tdap 04/29/2014, 02/10/2018   Past Medical History:  Diagnosis Date   Allergy    enviromental   Diverticulitis of large intestine without perforation or abscess without bleeding 05/10/2021   Hx of adenomatous colonic polyps 08/25/2021   2 diminutive   Hyperlipidemia    Migraines    past hx   Osteoporosis    Social History   Socioeconomic History   Marital status: Married    Spouse name: Not on file   Number of children: Not on file   Years of education: Not on file   Highest education level: Associate degree: occupational, Scientist, product/process development, or vocational program  Occupational History   Not on file  Tobacco Use   Smoking status: Former    Current packs/day: 0.00    Average packs/day: 0.5 packs/day for 10.0 years (5.0 ttl pk-yrs)    Types: Cigarettes    Start date: 12/18/1976    Quit date: 12/18/1986    Years since quitting: 37.8   Smokeless tobacco: Never  Vaping Use   Vaping status: Never Used  Substance and Sexual Activity   Alcohol use: Yes    Alcohol/week: 2.0 standard drinks of alcohol    Types: 2 Glasses of wine per week   Drug use: No   Sexual activity: Not on file  Other Topics Concern   Not on file  Social History Narrative   Not on file   Social Drivers of Health   Financial Resource Strain: Low Risk  (10/01/2024)  Overall Financial Resource Strain (CARDIA)    Difficulty of Paying Living Expenses: Not hard at all  Food Insecurity: No Food Insecurity (10/01/2024)   Hunger Vital Sign    Worried About Running Out of Food in the Last Year: Never true    Ran Out of Food in the Last Year: Never true  Transportation Needs: No Transportation Needs (10/01/2024)   PRAPARE - Administrator, Civil Service (Medical): No    Lack of Transportation (Non-Medical): No  Physical Activity: Sufficiently Active (10/01/2024)   Exercise Vital Sign    Days of Exercise per Week: 5 days    Minutes of Exercise per Session: 40 min  Stress: No Stress Concern Present  (10/01/2024)   [employer] of Occupational Health - Occupational Stress Questionnaire    Feeling of Stress: Not at all  Social Connections: Socially Integrated (10/01/2024)   Social Connection and Isolation Panel    Frequency of Communication with Friends and Family: More than three times a week    Frequency of Social Gatherings with Friends and Family: Twice a week    Attends Religious Services: More than 4 times per year    Active Member of [employer] or Organizations: Yes    Attends Banker Meetings: 1 to 4 times per year    Marital Status: Married  Catering manager Violence: Not on file   Past Surgical History:  Procedure Laterality Date   ABDOMINAL HYSTERECTOMY  12/19/1999   CESAREAN SECTION     2 occasions   COLONOSCOPY  09/18/2011   TUBAL LIGATION     prior to hysterectomy   Family History  Problem Relation Age of Onset   Pneumonia Mother    Hyperlipidemia Paternal Grandmother        diet controlled   Migraines Neg Hx    Colon cancer Neg Hx    Colon polyps Neg Hx    Esophageal cancer Neg Hx    Rectal cancer Neg Hx    Stomach cancer Neg Hx     Current Outpatient Medications:    cholecalciferol (VITAMIN D3) 25 MCG (1000 UNIT) tablet, Take 1,000 Units by mouth daily., Disp: , Rfl:    ezetimibe  (ZETIA ) 10 MG tablet, Take 1 tablet (10 mg total) by mouth daily., Disp: 90 tablet, Rfl: 1   fexofenadine -pseudoephedrine  (ALLEGRA-D 24) 180-240 MG 24 hr tablet, Take 1 tablet by mouth every evening. For allergy and congestion, Disp: 30 tablet, Rfl: 11   polyethylene glycol (MIRALAX / GLYCOLAX) 17 g packet, Take 17 g by mouth daily. Uses at least 4 x a week, Disp: , Rfl:    SUMAtriptan  (IMITREX ) 100 MG tablet, Take 1 tablet (100 mg total) by mouth every 2 (two) hours as needed for migraine. May repeat in 2 hours if headache persists or recurs., Disp: 9 tablet, Rfl: PRN  Current Facility-Administered Medications:    denosumab  (PROLIA ) injection 60 mg, 60 mg,  Subcutaneous, Once, Ricky Doan M, DO  No Known Allergies   ROS: Review of Systems Pertinent items noted in HPI and remainder of comprehensive ROS otherwise negative.    Physical exam BP 122/62   Pulse 78   Temp 97.9 F (36.6 C)   Ht 5' 3 (1.6 m)   Wt 148 lb 3.2 oz (67.2 kg)   SpO2 97%   BMI 26.25 kg/m  General appearance: alert, cooperative, appears stated age, and no distress Head: Normocephalic, without obvious abnormality, atraumatic Eyes: negative findings: lids and lashes normal, conjunctivae and sclerae normal,  corneas clear, and pupils equal, round, reactive to light and accomodation Ears: normal TM's and external ear canals both ears Nose: Nares normal. Septum midline. Mucosa normal. No drainage or sinus tenderness. Throat: lips, mucosa, and tongue normal; teeth and gums normal Neck: no adenopathy, no carotid bruit, supple, symmetrical, trachea midline, and thyroid  not enlarged, symmetric, no tenderness/mass/nodules Back: symmetric, no curvature. ROM normal. No CVA tenderness. Lungs: clear to auscultation bilaterally Heart: regular rate and rhythm, S1, S2 normal, no murmur, click, rub or gallop Abdomen: soft, non-tender; bowel sounds normal; no masses,  no organomegaly Extremities: extremities normal, atraumatic, no cyanosis or edema Pulses: 2+ and symmetric Skin: Skin color, texture, turgor normal. No rashes or lesions Lymph nodes: Cervical, supraclavicular, and axillary nodes normal. Neurologic: Alert and oriented X 3, normal strength and tone. Normal symmetric reflexes. Normal coordination and gait      12/27/2023   10:50 AM 11/15/2022   10:47 AM 10/17/2022    2:05 PM  Depression screen PHQ 2/9  Decreased Interest 0 0 0  Down, Depressed, Hopeless 0 0 0  PHQ - 2 Score 0 0 0  Altered sleeping 1 0   Tired, decreased energy 0 0   Change in appetite 0 0   Feeling bad or failure about yourself  0 0   Trouble concentrating 0 0   Moving slowly or  fidgety/restless 0 0   Suicidal thoughts 0 0   PHQ-9 Score 1 0   Difficult doing work/chores Somewhat difficult Not difficult at all       12/27/2023   10:49 AM 11/15/2022   10:47 AM 10/17/2022    2:05 PM 09/16/2021   11:13 AM  GAD 7 : Generalized Anxiety Score  Nervous, Anxious, on Edge 0 0 0 0  Control/stop worrying 0 0 0 0  Worry too much - different things 1 0 0 0  Trouble relaxing 1 0 0 0  Restless 1 0 0 0  Easily annoyed or irritable 0 0 0 0  Afraid - awful might happen 0 0 0 0  Total GAD 7 Score 3 0 0 0  Anxiety Difficulty Not difficult at all Not difficult at all Not difficult at all Not difficult at all    No results found for this or any previous visit (from the past 2160 hours).   Assessment/ Plan: Babetta JONETTA Gambler here for annual physical exam.   Annual physical exam  Pure hypercholesterolemia - Plan: ezetimibe  (ZETIA ) 10 MG tablet, CMP14+EGFR, Lipid Panel, TSH  Age-related osteoporosis without current pathological fracture - Plan: CMP14+EGFR, VITAMIN D  25 Hydroxy (Vit-D Deficiency, Fractures), DG [facility] DEXA  Vitamin D  deficiency - Plan: CMP14+EGFR, VITAMIN D  25 Hydroxy (Vit-D Deficiency, Fractures)  Migraine without aura and without status migrainosus, not intractable - Plan: CMP14+EGFR, CBC with Differential   She declined vaccination today.  DEXA scan ordered as this will be due next month.  Mammogram already scheduled.  She is otherwise up-to-date on preventive healthcare  Fasting labs collected along with vitamin D , CMP for osteoporosis.  Counseled on healthy lifestyle choices, including diet (rich in fruits, vegetables and lean meats and low in salt and simple carbohydrates) and exercise (at least 30 minutes of moderate physical activity daily).  Patient to follow up 1 year for CPE  Sandara Tyree M. Jolinda, DO

## 2024-10-02 LAB — CBC WITH DIFFERENTIAL/PLATELET
Basophils Absolute: 0.1 x10E3/uL (ref 0.0–0.2)
Basos: 1 %
EOS (ABSOLUTE): 0.4 x10E3/uL (ref 0.0–0.4)
Eos: 7 %
Hematocrit: 39.1 % (ref 34.0–46.6)
Hemoglobin: 13.1 g/dL (ref 11.1–15.9)
Immature Grans (Abs): 0 x10E3/uL (ref 0.0–0.1)
Immature Granulocytes: 0 %
Lymphocytes Absolute: 1.8 x10E3/uL (ref 0.7–3.1)
Lymphs: 28 %
MCH: 32.2 pg (ref 26.6–33.0)
MCHC: 33.5 g/dL (ref 31.5–35.7)
MCV: 96 fL (ref 79–97)
Monocytes Absolute: 0.5 x10E3/uL (ref 0.1–0.9)
Monocytes: 8 %
Neutrophils Absolute: 3.4 x10E3/uL (ref 1.4–7.0)
Neutrophils: 56 %
Platelets: 246 x10E3/uL (ref 150–450)
RBC: 4.07 x10E6/uL (ref 3.77–5.28)
RDW: 12.9 % (ref 11.7–15.4)
WBC: 6.2 x10E3/uL (ref 3.4–10.8)

## 2024-10-02 LAB — CMP14+EGFR
ALT: 22 IU/L (ref 0–32)
AST: 24 IU/L (ref 0–40)
Albumin: 4.5 g/dL (ref 3.9–4.9)
Alkaline Phosphatase: 68 IU/L (ref 49–135)
BUN/Creatinine Ratio: 17 (ref 12–28)
BUN: 12 mg/dL (ref 8–27)
Bilirubin Total: 1 mg/dL (ref 0.0–1.2)
CO2: 22 mmol/L (ref 20–29)
Calcium: 9.7 mg/dL (ref 8.7–10.3)
Chloride: 102 mmol/L (ref 96–106)
Creatinine, Ser: 0.7 mg/dL (ref 0.57–1.00)
Globulin, Total: 2.5 g/dL (ref 1.5–4.5)
Glucose: 80 mg/dL (ref 70–99)
Potassium: 4 mmol/L (ref 3.5–5.2)
Sodium: 140 mmol/L (ref 134–144)
Total Protein: 7 g/dL (ref 6.0–8.5)
eGFR: 97 mL/min/1.73 (ref >59)

## 2024-10-02 LAB — LIPID PANEL
Cholesterol, Total: 190 mg/dL (ref 100–199)
HDL: 55 mg/dL (ref >39)
LDL CALC COMMENT:: 3.5 ratio (ref 0.0–4.4)
LDL Chol Calc (NIH): 121 mg/dL — AB (ref 0–99)
Triglycerides: 79 mg/dL (ref 0–149)
VLDL Cholesterol Cal: 14 mg/dL (ref 5–40)

## 2024-10-02 LAB — TSH: TSH: 1.26 u[IU]/mL (ref 0.450–4.500)

## 2024-10-02 LAB — VITAMIN D 25 HYDROXY (VIT D DEFICIENCY, FRACTURES): Vit D, 25-Hydroxy: 56.5 ng/mL (ref 30.0–100.0)

## 2024-10-03 ENCOUNTER — Ambulatory Visit: Payer: Self-pay | Admitting: Family Medicine

## 2024-11-18 ENCOUNTER — Ambulatory Visit

## 2024-11-18 DIAGNOSIS — M810 Age-related osteoporosis without current pathological fracture: Secondary | ICD-10-CM | POA: Diagnosis not present

## 2024-11-20 NOTE — Progress Notes (Signed)
 Pt reports taking D3, potassium, calcium  and magnesium. FYI.

## 2024-12-25 ENCOUNTER — Telehealth: Payer: Self-pay | Admitting: Family Medicine

## 2024-12-25 NOTE — Telephone Encounter (Signed)
 I spoke with Carla Moreno. She is aware pt had a dexa scan done at [facility].

## 2024-12-25 NOTE — Telephone Encounter (Unsigned)
 Copied from CRM #[id number]. Topic: General - Other >> Dec 24, 2024  3:02 PM Willma R wrote: Reason for CRM: Dedra from [facility] calling to check if a fax was received for a referral for a DexaScan.  Dedra can be reached at [phone]

## 2025-10-05 ENCOUNTER — Encounter: Payer: Self-pay | Admitting: Family Medicine
